# Patient Record
Sex: Female | Born: 1935 | Race: White | Hispanic: No | Marital: Married | Smoking: Never smoker
Health system: Southern US, Community
[De-identification: ages and names within clinical notes are randomized; demographics above are authoritative.]

## PROBLEM LIST (undated history)

## (undated) DIAGNOSIS — F809 Developmental disorder of speech and language, unspecified: Secondary | ICD-10-CM

## (undated) DIAGNOSIS — M545 Low back pain, unspecified: Secondary | ICD-10-CM

## (undated) DIAGNOSIS — I639 Cerebral infarction, unspecified: Secondary | ICD-10-CM

## (undated) DIAGNOSIS — K219 Gastro-esophageal reflux disease without esophagitis: Secondary | ICD-10-CM

## (undated) DIAGNOSIS — R413 Other amnesia: Secondary | ICD-10-CM

## (undated) DIAGNOSIS — G4700 Insomnia, unspecified: Secondary | ICD-10-CM

## (undated) DIAGNOSIS — F419 Anxiety disorder, unspecified: Secondary | ICD-10-CM

## (undated) HISTORY — DX: Low back pain: M54.5

## (undated) HISTORY — PX: ABDOMINAL HYSTERECTOMY: SHX81

## (undated) HISTORY — DX: Anxiety disorder, unspecified: F41.9

## (undated) HISTORY — PX: TOTAL VAGINAL HYSTERECTOMY: SHX2548

## (undated) HISTORY — DX: Developmental disorder of speech and language, unspecified: F80.9

## (undated) HISTORY — PX: ADENOIDECTOMY: SUR15

## (undated) HISTORY — DX: Low back pain, unspecified: M54.50

## (undated) HISTORY — DX: Gastro-esophageal reflux disease without esophagitis: K21.9

## (undated) HISTORY — PX: TONSILLECTOMY: SUR1361

## (undated) HISTORY — PX: APPENDECTOMY: SHX54

## (undated) HISTORY — DX: Insomnia, unspecified: G47.00

## (undated) HISTORY — PX: CHOLECYSTECTOMY: SHX55

## (undated) HISTORY — DX: Other amnesia: R41.3

---

## 1983-12-17 DIAGNOSIS — I639 Cerebral infarction, unspecified: Secondary | ICD-10-CM

## 1983-12-17 HISTORY — DX: Cerebral infarction, unspecified: I63.9

## 1999-04-17 ENCOUNTER — Observation Stay (HOSPITAL_COMMUNITY): Admission: RE | Admit: 1999-04-17 | Discharge: 1999-04-18 | Payer: Self-pay | Admitting: Obstetrics and Gynecology

## 1999-12-13 ENCOUNTER — Encounter: Admission: RE | Admit: 1999-12-13 | Discharge: 1999-12-13 | Payer: Self-pay | Admitting: Internal Medicine

## 1999-12-13 ENCOUNTER — Encounter: Payer: Self-pay | Admitting: Internal Medicine

## 2000-12-15 ENCOUNTER — Encounter: Admission: RE | Admit: 2000-12-15 | Discharge: 2000-12-15 | Payer: Self-pay | Admitting: Internal Medicine

## 2000-12-15 ENCOUNTER — Encounter: Payer: Self-pay | Admitting: Internal Medicine

## 2001-01-08 ENCOUNTER — Encounter: Payer: Self-pay | Admitting: Orthopedic Surgery

## 2001-01-08 ENCOUNTER — Encounter: Admission: RE | Admit: 2001-01-08 | Discharge: 2001-01-08 | Payer: Self-pay | Admitting: Orthopedic Surgery

## 2001-01-21 ENCOUNTER — Encounter: Admission: RE | Admit: 2001-01-21 | Discharge: 2001-01-21 | Payer: Self-pay | Admitting: Internal Medicine

## 2001-01-21 ENCOUNTER — Encounter: Payer: Self-pay | Admitting: Internal Medicine

## 2001-09-16 ENCOUNTER — Encounter: Admission: RE | Admit: 2001-09-16 | Discharge: 2001-09-16 | Payer: Self-pay | Admitting: Internal Medicine

## 2001-09-16 ENCOUNTER — Encounter: Payer: Self-pay | Admitting: Internal Medicine

## 2002-01-14 ENCOUNTER — Encounter: Admission: RE | Admit: 2002-01-14 | Discharge: 2002-01-14 | Payer: Self-pay | Admitting: Internal Medicine

## 2002-01-14 ENCOUNTER — Encounter: Payer: Self-pay | Admitting: Internal Medicine

## 2002-01-29 ENCOUNTER — Other Ambulatory Visit: Admission: RE | Admit: 2002-01-29 | Discharge: 2002-01-29 | Payer: Self-pay | Admitting: Obstetrics and Gynecology

## 2002-07-06 ENCOUNTER — Encounter: Admission: RE | Admit: 2002-07-06 | Discharge: 2002-07-06 | Payer: Self-pay | Admitting: Internal Medicine

## 2002-07-06 ENCOUNTER — Encounter: Payer: Self-pay | Admitting: Internal Medicine

## 2002-09-20 ENCOUNTER — Encounter: Admission: RE | Admit: 2002-09-20 | Discharge: 2002-09-20 | Payer: Self-pay | Admitting: Internal Medicine

## 2002-09-20 ENCOUNTER — Encounter: Payer: Self-pay | Admitting: Internal Medicine

## 2002-10-08 ENCOUNTER — Encounter: Payer: Self-pay | Admitting: Internal Medicine

## 2002-10-08 ENCOUNTER — Encounter: Admission: RE | Admit: 2002-10-08 | Discharge: 2002-10-08 | Payer: Self-pay | Admitting: Internal Medicine

## 2002-10-11 ENCOUNTER — Encounter: Admission: RE | Admit: 2002-10-11 | Discharge: 2002-10-11 | Payer: Self-pay | Admitting: Internal Medicine

## 2002-10-11 ENCOUNTER — Encounter: Payer: Self-pay | Admitting: Internal Medicine

## 2002-10-16 ENCOUNTER — Encounter: Payer: Self-pay | Admitting: Internal Medicine

## 2002-10-16 ENCOUNTER — Ambulatory Visit (HOSPITAL_COMMUNITY): Admission: RE | Admit: 2002-10-16 | Discharge: 2002-10-16 | Payer: Self-pay | Admitting: Internal Medicine

## 2003-01-17 ENCOUNTER — Encounter: Admission: RE | Admit: 2003-01-17 | Discharge: 2003-01-17 | Payer: Self-pay | Admitting: Internal Medicine

## 2003-01-17 ENCOUNTER — Encounter: Payer: Self-pay | Admitting: Internal Medicine

## 2003-01-17 ENCOUNTER — Other Ambulatory Visit: Admission: RE | Admit: 2003-01-17 | Discharge: 2003-01-17 | Payer: Self-pay | Admitting: Internal Medicine

## 2003-03-28 ENCOUNTER — Ambulatory Visit (HOSPITAL_COMMUNITY): Admission: RE | Admit: 2003-03-28 | Discharge: 2003-03-28 | Payer: Self-pay | Admitting: Orthopedic Surgery

## 2003-03-28 ENCOUNTER — Encounter: Payer: Self-pay | Admitting: Orthopedic Surgery

## 2003-04-05 ENCOUNTER — Encounter: Payer: Self-pay | Admitting: Orthopedic Surgery

## 2003-04-06 ENCOUNTER — Observation Stay (HOSPITAL_COMMUNITY): Admission: RE | Admit: 2003-04-06 | Discharge: 2003-04-07 | Payer: Self-pay | Admitting: Orthopedic Surgery

## 2003-06-27 ENCOUNTER — Ambulatory Visit: Admission: RE | Admit: 2003-06-27 | Discharge: 2003-06-27 | Payer: Self-pay | Admitting: Internal Medicine

## 2003-06-27 ENCOUNTER — Encounter: Payer: Self-pay | Admitting: Internal Medicine

## 2003-07-13 ENCOUNTER — Ambulatory Visit (HOSPITAL_COMMUNITY): Admission: RE | Admit: 2003-07-13 | Discharge: 2003-07-13 | Payer: Self-pay | Admitting: Gastroenterology

## 2003-09-15 ENCOUNTER — Ambulatory Visit (HOSPITAL_COMMUNITY): Admission: RE | Admit: 2003-09-15 | Discharge: 2003-09-15 | Payer: Self-pay | Admitting: Gastroenterology

## 2004-01-19 ENCOUNTER — Encounter: Admission: RE | Admit: 2004-01-19 | Discharge: 2004-01-19 | Payer: Self-pay | Admitting: Internal Medicine

## 2004-05-28 ENCOUNTER — Encounter: Admission: RE | Admit: 2004-05-28 | Discharge: 2004-05-28 | Payer: Self-pay | Admitting: Internal Medicine

## 2004-06-07 ENCOUNTER — Other Ambulatory Visit: Admission: RE | Admit: 2004-06-07 | Discharge: 2004-06-07 | Payer: Self-pay | Admitting: Obstetrics and Gynecology

## 2004-06-11 ENCOUNTER — Ambulatory Visit (HOSPITAL_COMMUNITY): Admission: RE | Admit: 2004-06-11 | Discharge: 2004-06-11 | Payer: Self-pay | Admitting: Obstetrics and Gynecology

## 2004-06-19 ENCOUNTER — Encounter: Admission: RE | Admit: 2004-06-19 | Discharge: 2004-06-19 | Payer: Self-pay | Admitting: Obstetrics and Gynecology

## 2004-06-20 ENCOUNTER — Ambulatory Visit (HOSPITAL_BASED_OUTPATIENT_CLINIC_OR_DEPARTMENT_OTHER): Admission: RE | Admit: 2004-06-20 | Discharge: 2004-06-20 | Payer: Self-pay | Admitting: Obstetrics and Gynecology

## 2004-06-20 ENCOUNTER — Ambulatory Visit (HOSPITAL_COMMUNITY): Admission: RE | Admit: 2004-06-20 | Discharge: 2004-06-20 | Payer: Self-pay | Admitting: Obstetrics and Gynecology

## 2004-06-20 ENCOUNTER — Encounter (INDEPENDENT_AMBULATORY_CARE_PROVIDER_SITE_OTHER): Payer: Self-pay | Admitting: *Deleted

## 2005-01-29 ENCOUNTER — Encounter: Admission: RE | Admit: 2005-01-29 | Discharge: 2005-01-29 | Payer: Self-pay | Admitting: Internal Medicine

## 2006-02-04 ENCOUNTER — Encounter: Admission: RE | Admit: 2006-02-04 | Discharge: 2006-02-04 | Payer: Self-pay | Admitting: Internal Medicine

## 2006-02-18 ENCOUNTER — Encounter: Admission: RE | Admit: 2006-02-18 | Discharge: 2006-02-18 | Payer: Self-pay | Admitting: Internal Medicine

## 2007-10-28 ENCOUNTER — Encounter: Admission: RE | Admit: 2007-10-28 | Discharge: 2007-10-28 | Payer: Self-pay | Admitting: Internal Medicine

## 2009-04-05 ENCOUNTER — Encounter: Admission: RE | Admit: 2009-04-05 | Discharge: 2009-04-05 | Payer: Self-pay | Admitting: Obstetrics and Gynecology

## 2009-05-24 ENCOUNTER — Encounter: Admission: RE | Admit: 2009-05-24 | Discharge: 2009-05-24 | Payer: Self-pay | Admitting: Internal Medicine

## 2009-07-28 ENCOUNTER — Encounter (INDEPENDENT_AMBULATORY_CARE_PROVIDER_SITE_OTHER): Payer: Self-pay | Admitting: Neurology

## 2009-07-28 ENCOUNTER — Ambulatory Visit: Payer: Self-pay

## 2009-07-31 ENCOUNTER — Encounter: Payer: Self-pay | Admitting: Cardiology

## 2011-01-06 ENCOUNTER — Encounter: Payer: Self-pay | Admitting: Internal Medicine

## 2011-01-16 ENCOUNTER — Other Ambulatory Visit: Payer: Self-pay | Admitting: Neurology

## 2011-01-16 DIAGNOSIS — R413 Other amnesia: Secondary | ICD-10-CM

## 2011-01-17 ENCOUNTER — Ambulatory Visit
Admission: RE | Admit: 2011-01-17 | Discharge: 2011-01-17 | Disposition: A | Discharge status: Home / Self Care | Payer: Medicare Other | Source: Ambulatory Visit | Attending: Neurology | Admitting: Neurology

## 2011-01-17 DIAGNOSIS — R413 Other amnesia: Secondary | ICD-10-CM

## 2011-05-03 NOTE — Op Note (Signed)
   NAME:  [name], [name]                          ACCOUNT NO.:  [id number]   MEDICAL RECORD NO.:  [id number]                   PATIENT TYPE:  AMB   LOCATION:  ENDO                                 FACILITY:  [facility]   PHYSICIAN:  Danise Edge, M.D.                DATE OF BIRTH:  11-Jan-1932   DATE OF PROCEDURE:  09/15/2003  DATE OF DISCHARGE:                                 OPERATIVE REPORT   PROCEDURE:  Proctocolonoscopy.   PROCEDURE INDICATION:  [name] is a 77 year old female, born  April 08, 1932.  [name] has recovered from a bout of diverticulitis  and is scheduled to undergo a screening colonoscopy with polypectomy to  prevent colon cancer.   ENDOSCOPIST:  Danise Edge, M.D.   PRE-MEDICATION:  Versed 7.5 mg, Demerol 50 mg.   PROCEDURE:  After obtaining informed consent, [name] was placed in the  left lateral decubitus position.  I administered intravenous Demerol and  intravenous Versed to achieve conscious sedation for the above procedure.  The patient's blood pressure, oxygen saturation and cardiac rhythm were  monitored throughout the procedure and documented in the medical record.   Anal inspection was normal.  Digital rectal exam was normal.  The Olympus  pediatric colonoscope was introduced into the rectum and advanced to the  cecum.  Colonic preparation for the exam today was excellent.   Rectum:  Normal.  Sigmoid colon and descending colon:  Left colonic  diverticulosis, without diverticulitis or diverticular stricture formation.  Splenic flexure:  Normal.  Transverse colon:  Normal.  Hepatic flexure:  Normal.  Ascending colon:  Normal.  Cecum and ileocecal valve:  Normal.   ASSESSMENT:  Left colonic diverticulosis; otherwise, normal  proctocolonoscopy to the cecum.  No endoscopic evidence for the presence of  colorectal neoplasia.                                               Danise Edge, M.D.    MJ/MEDQ  D:  09/15/2003  T:  09/15/2003   Job:  [id number]   cc:   Candyce Churn, M.D.  [address] [place]  [place] [address]  Fax: [phone]

## 2011-05-03 NOTE — Op Note (Signed)
   NAME:  [name], [name]                          ACCOUNT NO.:  [id number]   MEDICAL RECORD NO.:  [id number]                   PATIENT TYPE:  AMB   LOCATION:  ENDO                                 FACILITY:  [facility]   PHYSICIAN:  Danise Edge, M.D.                DATE OF BIRTH:  [birth year]   DATE OF PROCEDURE:  07/13/2003  DATE OF DISCHARGE:                                 OPERATIVE REPORT   PROCEDURES:  Esophagogastroduodenoscopy.   PROCEDURE INDICATIONS:  Ms. [name] is a 77 year old female with  chronic gastrointestinal reflux disease.  While on oxycodone, she developed  epigastric pain.  Since stopping her oxycodone, her epigastric pain as  resolved.   ENDOSCOPIST:  Danise Edge, M.D.   PREMEDICATION:  1. Versed 7.5 mg.  2. Demerol 50 mg.   DESCRIPTION OF PROCEDURE:  After obtaining informed consent, Ms. [name] was  placed in the left lateral decubitus position.  I administered intravenous  Demerol and intravenous Versed to achieve conscious sedation for the  procedure.  The patient's blood pressure, oxygen saturation, and cardiac  rhythm were monitored throughout the procedure and documented in the medical  record.   The Olympus gastroscope was passed through the posterior hypopharynx into  the proximal esophagus without difficulty.  The hypopharynx, larynx, and  vocal cords appeared normal.   Esophagoscopy:  The proximal, mid, and lower segments of the esophageal  mucosa appeared completely normal endoscopically.  There was no endoscopic  evidence for the presence of erosive esophagitis, Barrett's esophagus, or  esophageal mucosal scar.   Gastroscopy:  Ms. [name] has a very small hiatal hernia.  Retroflexed view  of her gastric cardia and fundus was normal.  The diaphragmatic hiatus was  slightly patulous.  The gastric body, antrum, and pyloris appear normal.   Duodenoscopy:  The duodenal bulb, mid duodenum, and distal duodenum appear  normal.   ASSESSMENT:   Normal esophagogastroduodenoscopy.                                               Danise Edge, M.D.    MJ/MEDQ  D:  07/13/2003  T:  07/13/2003  Job:  [id number]   cc:   Candyce Churn, M.D.  [address] [place]  [place] [address]  Fax: [phone]

## 2011-05-03 NOTE — Op Note (Signed)
   NAME:  [name], [name]                          ACCOUNT NO.:  [id number]   MEDICAL RECORD NO.:  [id number]                   PATIENT TYPE:  OBV   LOCATION:  0456                                 FACILITY:  [facility]   PHYSICIAN:  Georges Lynch. Gioffre, M.D.             DATE OF BIRTH:  12-24-1931   DATE OF PROCEDURE:  04/06/2003  DATE OF DISCHARGE:                                 OPERATIVE REPORT   PREOPERATIVE DIAGNOSIS:  Severe impingement syndrome with small undersurface  tears of the rotator cuff tendon.   POSTOPERATIVE DIAGNOSIS:  Severe impingement syndrome with small  undersurface tears of the rotator cuff tendon.   PROCEDURES:  1. Open decompression of the right shoulder by performing a partial     acromionectomy and acromioplasty.  2. Excision of subdeltoid bursa.  3. Exploration of the rotator cuff.   SURGEON:  Georges Lynch. Darrelyn Hillock, M.D.   ASSISTANT:  Ebbie Ridge. Paitsel, P.A.   DESCRIPTION OF PROCEDURE:  Under general anesthesia, prior the general  anesthesia she had an interscalene block to the right shoulder.  Sterile  prep and drape of the right shoulder was carried out.  She was given 1 g of  IV Ancef.  An incision was made over the anterior aspect of the right  shoulder, bleeders identified and cauterized.  I then inserted self-  retaining retractors and stripped the deltoid tendon from the acromion in  the usual fashion and partially split the proximal portion of the deltoid.  I went down immediately and noted a severe impingement syndrome.  Her  acromion was overgrowth, that is, it was much thicker than normal, and it  was beak-shaped and actually was embedding down into the tendon.  The tendon  was slightly abraded but not completely torn.  I further examined the tendon  after removing the subdeltoid bursa.  I took the shoulder through a complete  range of motion.  There was no other abnormality.  I did not feel that a  graft was necessary nor a tendon repair.  I thoroughly  irrigated out the  area after doing my acromionectomy with my oscillating saw and then  utilizing the bur to even out the area.  I then reapproximated the deltoid  tendon and the muscle in the usual fashion. The subcu was closed with 0  Vicryl, skin with metal staples.  A sterile Neosporin dressing was applied.  She was placed in a sling.                                               Ronald A. Darrelyn Hillock, M.D.    RAG/MEDQ  D:  04/06/2003  T:  04/06/2003  Job:  [id number]

## 2011-05-03 NOTE — Op Note (Signed)
NAME:  [name], [name]                          ACCOUNT NO.:  [id number]   MEDICAL RECORD NO.:  [id number]                   PATIENT TYPE:  AMB   LOCATION:  NESC                                 FACILITY:  [facility]   PHYSICIAN:  Malva Limes, M.D.                 DATE OF BIRTH:  1932-01-02   DATE OF PROCEDURE:  06/20/2004  DATE OF DISCHARGE:                                 OPERATIVE REPORT   PREOPERATIVE DIAGNOSES:  Left ovarian cyst in menopausal patient.   POSTOPERATIVE DIAGNOSES:  Left ovarian cyst in menopausal patient.   PROCEDURE:  Laparoscopic bilateral salpingo-oophorectomy.   SURGEON:  Malva Limes, M.D.   ANESTHESIA:  General endotracheal.   ANTIBIOTICS:  Ancef 1 g.   ESTIMATED BLOOD LOSS:  Minimal.   COMPLICATIONS:  None.   SPECIMENS:  Right and left fallopian tube and ovary sent to pathology.   FINDINGS:  Upon entering the abdominal cavity, the patient had no evidence  of any pelvic adhesions. There was a 3.4 cm left adnexal cyst, the right  ovary appeared to be normal.   DESCRIPTION OF PROCEDURE:  The patient was taken to the operating room where  a general anesthetic was administered without complications. She was then  placed in the dorsal lithotomy position.  She was prepped with Hibiclens and  her bladder drained with a red rubber catheter. She was then draped in the  usual fashion for this procedure.  Her umbilicus was injected with 0.25%  Marcaine. A vertical skin incision was made, fascia was grasped, incised  with the Mayo scissors. The parietoperitoneum was entered bluntly. Sutures  were placed in the fascia at 3 and 9 o'clock. The trocar was then placed in  the abdominal cavity and 3 liters of carbon monoxide was then insufflated.  The patient was then placed in Trendelenburg and the scope placed. A 5 mm  port was then placed in the suprapubic and left lower quadrant under direct  visualization. Once this was accomplished, it was discovered that the  left  adnexa was adherent to the left pelvic sidewall covered by a layer of  peritoneum.  The infundibulopelvic ligament was identified, grasped,  cauterized with a tripolar instrument and transected. Following this, the  ovary was peeled off the pelvic sidewall.  In doing this, the cyst was  ruptured and fluid was noted to be yellow and clear.  Once the ovary was  peeled off the pelvic sidewall, the specimen was removed through the 10 mm  trocar in the umbilical port. Following this, the right ovary was grasped  and infundibulopelvic ligament and ureter identified. The infundibulopelvic  ligament was then cauterized and transected, the ovary removed, peeled off  the sidewall, also removed through the umbilical port. Following this, the  pelvis was irrigation copiously, no active bleeding was identified, ureters  appeared to be normal. At this point, the pneumoperitoneum was released and  the  instruments removed. The fascia was closed with interrupted #0 Vicryl  suture, the skin closed with 4-0 Vicryl suture in  interrupted fashion. The patient was taken to the recovery room in stable  condition.  Instrument and lap counts were correct x1.  The patient was  discharged to home with Percocet to take p.r.n.  She was instructed to  followup in the office in two weeks.                                               Malva Limes, M.D.    MA/MEDQ  D:  06/20/2004  T:  06/20/2004  Job:  [id number]

## 2011-05-08 ENCOUNTER — Other Ambulatory Visit: Payer: Self-pay | Admitting: Obstetrics and Gynecology

## 2013-09-30 ENCOUNTER — Other Ambulatory Visit: Payer: Self-pay

## 2013-09-30 MED ORDER — DONEPEZIL HCL 10 MG PO TABS: 10.0000 mg | ORAL_TABLET | ORAL | Status: DC

## 2013-10-12 ENCOUNTER — Ambulatory Visit (INDEPENDENT_AMBULATORY_CARE_PROVIDER_SITE_OTHER): Payer: Medicare Other | Admitting: Neurology

## 2013-10-12 ENCOUNTER — Encounter (INDEPENDENT_AMBULATORY_CARE_PROVIDER_SITE_OTHER): Payer: Self-pay

## 2013-10-12 ENCOUNTER — Encounter: Payer: Self-pay | Admitting: Neurology

## 2013-10-12 VITALS — BP 142/79 | HR 76 | Resp 16 | Ht 67.0 in | Wt 166.0 lb

## 2013-10-12 DIAGNOSIS — F0390 Unspecified dementia without behavioral disturbance: Secondary | ICD-10-CM

## 2013-10-12 DIAGNOSIS — F068 Other specified mental disorders due to known physiological condition: Secondary | ICD-10-CM

## 2013-10-12 MED ORDER — DONEPEZIL HCL 10 MG PO TABS: 10.0000 mg | ORAL_TABLET | ORAL | Status: DC

## 2013-10-12 NOTE — Progress Notes (Signed)
[facility]  Provider:  Melvyn Novas, M D  Referring Provider: No ref. provider found Primary Care Physician:  [name], MD  Chief Complaint  Patient presents with  . Memory Loss    F/U Visit MOCA Test was given durring visit    HPI:  [name] is a 79 y.o. female  Is seen here as a referral/ revisit  from Dr. Kevan Ny.   Has been followed by me since 2011. I had placed her initially on Aricept medication to help her stabilize mammary and the band rapid progression of memory loss. Initially the patient was stretching the medication had was not taking it daily. She was repeatedly examined by aMini-Mental status exam and her point count had been 26/30 in general he does thousand 12 and 22/30 by October 2013 animal fluency test was 15 point at the time in October of 2013 her last visit with me she ag my concern was the immediate recall the patient was not is oriented and had no problems,  wears knowing birthdates and her address etc.  But she had difficulties recalling 3 words after being distracted with a calculation as questioned. Dr. Kevan Ny, I primary care physician had started her on Namenda in addition to the Aricept.  She seemed to tolerate the medications rather well.   MOCA was performed today - and patient scored 18 out of 30 points.  MMSE was not performed.    Review of Systems: Out of a complete 14 system review, the patient complains of only the following symptoms, and all other reviewed systems are negative.  the patient is still driving - I am opposed to her operating a vehicle.   Her husband corrected her , stating she does not drive by herself, and either he or their daughter will drive her. He than stated she drives herself to choir practice  Wednesdays just up the Road.  GDS 2 points,   History   Social History  . Marital Status: Married    Spouse Name: N/A    Number of Children: N/A  . Years of Education: N/A   Occupational History   . Not on file.   Social History Main Topics  . Smoking status: Never Smoker   . Smokeless tobacco: Not on file  . Alcohol Use: No  . Drug Use: No  . Sexual Activity: Not on file   Other Topics Concern  . Not on file   Social History Narrative  . No narrative on file    History reviewed. No pertinent family history.  Past Medical History  Diagnosis Date  . Anxiety   . GERD (gastroesophageal reflux disease)   . Insomnia   . Memory loss   . Lumbago   . Speech delay     Past Surgical History  Procedure Laterality Date  . Tonsillectomy    . Adenoidectomy    . Cholecystectomy    . Total vaginal hysterectomy      Current Outpatient Prescriptions  Medication Sig Dispense Refill  . aspirin 81 MG tablet Take 81 mg by mouth daily.      . Cyanocobalamin (VITAMIN B 12 PO) Take by mouth.      . donepezil (ARICEPT) 10 MG tablet Take 1 tablet (10 mg total) by mouth every morning.  30 tablet  0  . esomeprazole (NEXIUM) 10 MG packet Take 10 mg by mouth daily before breakfast.      . pantoprazole (PROTONIX) 40 MG tablet  No current facility-administered medications for this visit.    Allergies as of 10/12/2013  . (No Known Allergies)    Vitals: BP 142/79  Pulse 76  Resp 16  Ht 5\' 7"  (1.702 m)  Wt 166 lb (75.297 kg)  BMI 25.99 kg/m2 Last Weight:  Wt Readings from Last 1 Encounters:  10/12/13 166 lb (75.297 kg)   Last Height:   Ht Readings from Last 1 Encounters:  10/12/13 5\' 7"  (1.702 m)   Mental Status: Alert, oriented, thought content  Is tangential , she is scoring poor on MOCA at 18 points out of 30 , dementia.    Speech fluent without evidence of aphasia. Able to follow 3 step commands without difficulty. Cranial Nerves: II-Discs flat bilaterally. Visual fields grossly intact. III/IV/VI-Extraocular movements intact.  Pupils reactive bilaterally. V/VII-Smile symmetric VIII-grossly intact IX/X-normal gag XI-bilateral shoulder shrug XII-midline tongue  extension Motor: 5/5 bilaterally with normal tone and bulk Sensory: Pinprick and light touch intact throughout, bilaterally Deep Tendon Reflexes: 2+ and symmetric throughout Plantars: Downgoing bilaterally Cerebellar: Normal finger-to-nose, normal rapid alternating movements and normal heel-to-shin test.  Normal gait and station.   Assessment:  1)Dementia on Namenda and Aricept, meds to  continue.  EEG repeat.  2) patient  Is not to drive alone , at night or on high ways.

## 2013-10-12 NOTE — Patient Instructions (Signed)
Dementia Dementia is a general term for problems with brain function, short term memory loss.    A person with dementia has memory loss and a hard time with at least one other brain function such as thinking, speaking, or problem solving.  Dementia can affect social functioning, how you do your job, your mood, or your personality.  The changes may be hidden for a long time.  The earliest forms of this disease are usually not detected by family or friends. Dementia can be:  Irreversible.  Potentially reversible.  Partially reversible.  Progressive. This means it can get worse over time. CAUSES  Irreversible dementia causes may include:  Degeneration of brain cells (Alzheimer's disease or lewy body dementia).  Multiple small strokes (vascular dementia).  Infection (chronic meningitis or Creutzfelt-Jakob disease).  Frontotemporal dementia. This affects younger people, age 58 to 87, compared to those who have Alzheimer's disease.  Dementia associated with other disorders like Parkinson's disease, Huntington's disease, or HIV-associated dementia. Potentially or partially reversible dementia causes may include:  Medicines.  Metabolic causes such as excessive alcohol intake, vitamin B12 deficiency, or thyroid disease.  Masses or pressure in the brain such as a tumor, blood clot, or hydrocephalus. SYMPTOMS  Symptoms are often hard to detect. Family members or coworkers may not notice them early in the disease process. Different people with dementia may have different symptoms. Symptoms can include:  A hard time with memory, especially recent memory. Long-term memory may not be impaired.  Asking the same question multiple times or forgetting something someone just said.  A hard time speaking your thoughts or finding certain words.  A hard time solving problems or performing familiar tasks (such as how to use a telephone).  Sudden changes in mood.  Changes in personality,  especially increasing moodiness or mistrust.  Depression.  A hard time understanding complex ideas that were never a problem in the past. DIAGNOSIS  There are no specific tests for dementia.   Your caregiver may recommend a thorough evaluation. This is because some forms of dementia can be reversible. The evaluation will likely include a physical exam and getting a detailed history from you and a family member. The history often gives the best clues and suggestions for a diagnosis.  Memory testing may be done. A detailed brain function evaluation called neuropsychologic testing may be helpful.  Lab tests and brain imaging (such as a CT scan or MRI scan) are sometimes important.  Sometimes observation and re-evaluation over time is very helpful. TREATMENT  Treatment depends on the cause.   If the problem is a vitamin deficiency, it may be helped or cured with supplements.  For dementias such as Alzheimer's disease, medicines are available to stabilize or slow the course of the disease. There are no cures for this type of dementia.  Your caregiver can help direct you to groups, organizations, and other caregivers to help with decisions in the care of you or your loved one. HOME CARE INSTRUCTIONS The care of individuals with dementia is varied and dependent upon the progression of the dementia. The following suggestions are intended for the person living with, or caring for, the person with dementia.  Create a safe environment.  Remove the locks on bathroom doors to prevent the person from accidentally locking himself or herself in.  Use childproof latches on kitchen cabinets and any place where cleaning supplies, chemicals, or alcohol are kept.  Use childproof covers in unused electrical outlets.  Install childproof devices to keep doors  and windows secured.  Remove stove knobs or install safety knobs and an automatic shut-off on the stove.  Lower the temperature on water  heaters.  Label medicines and keep them locked up.  Secure knives, lighters, matches, power tools, and guns, and keep these items out of reach.  Keep the house free from clutter. Remove rugs or anything that might contribute to a fall.  Remove objects that might break and hurt the person.  Make sure lighting is good, both inside and outside.  Install grab rails as needed.  Use a monitoring device to alert you to falls or other needs for help.  Reduce confusion.  Keep familiar objects and people around.  Use night lights or dim lights at night.  Label items or areas.  Use reminders, notes, or directions for daily activities or tasks.  Keep a simple, consistent routine for waking, meals, bathing, dressing, and bedtime.  Create a calm, quiet environment.  Place large clocks and calendars prominently.  Display emergency numbers and home address near all telephones.  Use cues to establish different times of the day. An example is to open curtains to let the natural light in during the day.   Use effective communication.  Choose simple words and short sentences.  Use a gentle, calm tone of voice.  Be careful not to interrupt.  If the person is struggling to find a word or communicate a thought, try to provide the word or thought.  Ask one question at a time. Allow the person ample time to answer questions. Repeat the question again if the person does not respond.  Reduce nighttime restlessness.  Provide a comfortable bed.  Have a consistent nighttime routine.  Ensure a regular walking or physical activity schedule. Involve the person in daily activities as much as possible.  Limit napping during the day.  Limit caffeine.  Attend social events that stimulate rather than overwhelm the senses.  Encourage good nutrition and hydration.  Reduce distractions during meal times and snacks.  Avoid foods that are too hot or too cold.  Monitor chewing and swallowing  ability.  Continue with routine vision, hearing, dental, and medical screenings.  Only give over-the-counter or prescription medicines as directed by the caregiver.  Monitor driving abilities. Do not allow the person to drive when safe driving is no longer possible.  Register with an identification program which could provide location assistance in the event of a missing person situation. SEEK MEDICAL CARE IF:   New behavioral problems start such as moodiness, aggressiveness, or seeing things that are not there (hallucinations).  Any new problem with brain function happens. This includes problems with balance, speech, or falling a lot.  Problems with swallowing develop.  Any symptoms of other illness happen. Small changes or worsening in any aspect of brain function can be a sign that the illness is getting worse. It can also be a sign of another medical illness such as infection. Seeing a caregiver right away is important. SEEK IMMEDIATE MEDICAL CARE IF:   A fever develops.  New or worsened confusion develops.  New or worsened sleepiness develops.  Staying awake becomes hard to do. Document Released: 05/28/2001 Document Revised: 02/24/2012 Document Reviewed: 04/29/2011 [facility] Patient Information 2014 [place], [place].

## 2014-02-06 ENCOUNTER — Emergency Department (HOSPITAL_COMMUNITY): Payer: Medicare Other

## 2014-02-06 ENCOUNTER — Inpatient Hospital Stay (HOSPITAL_COMMUNITY)
Admission: EM | Admit: 2014-02-06 | Discharge: 2014-02-11 | DRG: 470 | Disposition: A | Discharge status: Home Health | Payer: Medicare Other | Attending: Internal Medicine | Admitting: Internal Medicine

## 2014-02-06 ENCOUNTER — Encounter (HOSPITAL_COMMUNITY): Payer: Self-pay | Admitting: Emergency Medicine

## 2014-02-06 DIAGNOSIS — I471 Supraventricular tachycardia: Secondary | ICD-10-CM | POA: Diagnosis not present

## 2014-02-06 DIAGNOSIS — W010XXA Fall on same level from slipping, tripping and stumbling without subsequent striking against object, initial encounter: Secondary | ICD-10-CM | POA: Diagnosis present

## 2014-02-06 DIAGNOSIS — Z8673 Personal history of transient ischemic attack (TIA), and cerebral infarction without residual deficits: Secondary | ICD-10-CM

## 2014-02-06 DIAGNOSIS — F411 Generalized anxiety disorder: Secondary | ICD-10-CM | POA: Diagnosis present

## 2014-02-06 DIAGNOSIS — S72009A Fracture of unspecified part of neck of unspecified femur, initial encounter for closed fracture: Principal | ICD-10-CM

## 2014-02-06 DIAGNOSIS — R000 Tachycardia, unspecified: Secondary | ICD-10-CM | POA: Diagnosis not present

## 2014-02-06 DIAGNOSIS — E876 Hypokalemia: Secondary | ICD-10-CM | POA: Diagnosis not present

## 2014-02-06 DIAGNOSIS — F068 Other specified mental disorders due to known physiological condition: Secondary | ICD-10-CM

## 2014-02-06 DIAGNOSIS — W009XXA Unspecified fall due to ice and snow, initial encounter: Secondary | ICD-10-CM

## 2014-02-06 DIAGNOSIS — Z7982 Long term (current) use of aspirin: Secondary | ICD-10-CM

## 2014-02-06 DIAGNOSIS — R413 Other amnesia: Secondary | ICD-10-CM | POA: Diagnosis present

## 2014-02-06 DIAGNOSIS — K219 Gastro-esophageal reflux disease without esophagitis: Secondary | ICD-10-CM

## 2014-02-06 DIAGNOSIS — F0390 Unspecified dementia without behavioral disturbance: Secondary | ICD-10-CM

## 2014-02-06 DIAGNOSIS — S72002A Fracture of unspecified part of neck of left femur, initial encounter for closed fracture: Secondary | ICD-10-CM

## 2014-02-06 HISTORY — DX: Cerebral infarction, unspecified: I63.9

## 2014-02-06 LAB — URINALYSIS, ROUTINE W REFLEX MICROSCOPIC
Glucose, UA: 100 mg/dL — AB
HGB URINE DIPSTICK: NEGATIVE
Ketones, ur: 15 mg/dL — AB
LEUKOCYTES UA: NEGATIVE
NITRITE: NEGATIVE
Protein, ur: NEGATIVE mg/dL
SPECIFIC GRAVITY, URINE: 1.028 (ref 1.005–1.030)
Urobilinogen, UA: 0.2 mg/dL (ref 0.0–1.0)
pH: 5.5 (ref 5.0–8.0)

## 2014-02-06 LAB — BASIC METABOLIC PANEL
BUN: 16 mg/dL (ref 6–23)
CHLORIDE: 105 meq/L (ref 96–112)
CO2: 23 meq/L (ref 19–32)
CREATININE: 0.64 mg/dL (ref 0.50–1.10)
Calcium: 9.2 mg/dL (ref 8.4–10.5)
GFR, EST NON AFRICAN AMERICAN: 82 mL/min — AB (ref >90)
GLUCOSE: 112 mg/dL — AB (ref 70–99)
POTASSIUM: 3.5 meq/L — AB (ref 3.7–5.3)
Sodium: 143 mEq/L (ref 137–147)

## 2014-02-06 LAB — PROTIME-INR
INR: 0.96 (ref 0.00–1.49)
PROTHROMBIN TIME: 12.6 s (ref 11.6–15.2)

## 2014-02-06 LAB — CBC WITH DIFFERENTIAL/PLATELET
Basophils Absolute: 0 10*3/uL (ref 0.0–0.1)
Basophils Relative: 1 % (ref 0–1)
Eosinophils Absolute: 0.1 10*3/uL (ref 0.0–0.7)
Eosinophils Relative: 2 % (ref 0–5)
HEMATOCRIT: 42.8 % (ref 36.0–46.0)
Hemoglobin: 14.5 g/dL (ref 12.0–15.0)
Lymphocytes Relative: 31 % (ref 12–46)
Lymphs Abs: 1.9 10*3/uL (ref 0.7–4.0)
MCH: 29.8 pg (ref 26.0–34.0)
MCHC: 33.9 g/dL (ref 30.0–36.0)
MCV: 88.1 fL (ref 78.0–100.0)
MONOS PCT: 9 % (ref 3–12)
Monocytes Absolute: 0.5 10*3/uL (ref 0.1–1.0)
NEUTROS PCT: 58 % (ref 43–77)
Neutro Abs: 3.5 10*3/uL (ref 1.7–7.7)
PLATELETS: 255 10*3/uL (ref 150–400)
RBC: 4.86 MIL/uL (ref 3.87–5.11)
RDW: 13.2 % (ref 11.5–15.5)
WBC: 6 10*3/uL (ref 4.0–10.5)

## 2014-02-06 LAB — TYPE AND SCREEN
ABO/RH(D): O POS
Antibody Screen: NEGATIVE

## 2014-02-06 LAB — ABO/RH: ABO/RH(D): O POS

## 2014-02-06 MED ORDER — DEXTROSE 5 % IV SOLN: 500.0000 mg | Freq: Four times a day (QID) | INTRAVENOUS | Status: DC | PRN

## 2014-02-06 MED ORDER — SENNA 8.6 MG PO TABS
1.0000 | ORAL_TABLET | Freq: Two times a day (BID) | ORAL | Status: DC
  Administered 2014-02-06 – 2014-02-10 (×7): 8.6 mg via ORAL
  Filled 2014-02-06 (×13): qty 1

## 2014-02-06 MED ORDER — MORPHINE SULFATE 2 MG/ML IJ SOLN
2.0000 mg | INTRAMUSCULAR | Status: DC | PRN
  Administered 2014-02-06: 2 mg via INTRAVENOUS
  Filled 2014-02-06: qty 1

## 2014-02-06 MED ORDER — KCL IN DEXTROSE-NACL 20-5-0.45 MEQ/L-%-% IV SOLN
INTRAVENOUS | Status: DC
  Administered 2014-02-06 – 2014-02-08 (×3): via INTRAVENOUS
  Filled 2014-02-06 (×11): qty 1000

## 2014-02-06 MED ORDER — HYDROCODONE-ACETAMINOPHEN 5-325 MG PO TABS
1.0000 | ORAL_TABLET | Freq: Four times a day (QID) | ORAL | Status: DC | PRN
  Administered 2014-02-06 – 2014-02-07 (×2): 1 via ORAL
  Administered 2014-02-07: 2 via ORAL
  Administered 2014-02-07 – 2014-02-11 (×3): 1 via ORAL
  Filled 2014-02-06: qty 2
  Filled 2014-02-06 (×5): qty 1

## 2014-02-06 MED ORDER — METHOCARBAMOL 500 MG PO TABS
500.0000 mg | ORAL_TABLET | Freq: Four times a day (QID) | ORAL | Status: DC | PRN
  Administered 2014-02-07 (×2): 500 mg via ORAL
  Filled 2014-02-06 (×2): qty 1

## 2014-02-06 MED ORDER — MORPHINE SULFATE 2 MG/ML IJ SOLN: 0.5000 mg | INTRAMUSCULAR | Status: DC | PRN

## 2014-02-06 MED ORDER — ONDANSETRON HCL 4 MG/2ML IJ SOLN
4.0000 mg | Freq: Once | INTRAMUSCULAR | Status: AC
  Administered 2014-02-06: 4 mg via INTRAVENOUS
  Filled 2014-02-06: qty 2

## 2014-02-06 MED ORDER — DOCUSATE SODIUM 100 MG PO CAPS
100.0000 mg | ORAL_CAPSULE | Freq: Two times a day (BID) | ORAL | Status: DC
  Administered 2014-02-06 – 2014-02-10 (×7): 100 mg via ORAL
  Filled 2014-02-06 (×11): qty 1

## 2014-02-06 NOTE — ED Provider Notes (Signed)
CSN: [id number]     Arrival date & time 02/06/14  1305 History   First MD Initiated Contact with Patient 02/06/14 1317     Chief Complaint  Patient presents with  . Fall  . Hip Pain     (Consider location/radiation/quality/duration/timing/severity/associated sxs/prior Treatment) HPI Comments: Patient with history of dementia presents after a fall. Patient slipped on the ice onto her left hip. She was unable to get up without assistance. She was helped to standing but can ambulate. She did not hit her head or shoulder. She currently complains of minimal pain in left hip and thigh which is much worse with movement. No treatments prior to arrival. Onset of symptoms acute. Course is constant.  The history is provided by the patient, medical records, the spouse and a relative.    Past Medical History  Diagnosis Date  . Anxiety   . GERD (gastroesophageal reflux disease)   . Insomnia   . Memory loss   . Lumbago   . dementia   . Stroke 1985    no deficits   Past Surgical History  Procedure Laterality Date  . Tonsillectomy    . Adenoidectomy    . Cholecystectomy    . Total vaginal hysterectomy    . Appendectomy    . Abdominal hysterectomy     Family History  Problem Relation Age of Onset  . Cancer Mother [phone]  . Cancer Father [phone]  . Cancer Father    History  Substance Use Topics  . Smoking status: Never Smoker   . Smokeless tobacco: Not on file  . Alcohol Use: No   OB History   Grav Para Term Preterm Abortions TAB SAB Ect Mult Living                 Review of Systems  Constitutional: Negative for fever.  HENT: Negative for rhinorrhea and sore throat.   Eyes: Negative for redness.  Respiratory: Negative for cough.   Cardiovascular: Negative for chest pain.  Gastrointestinal: Negative for nausea, vomiting, abdominal pain and diarrhea.  Genitourinary: Negative for dysuria.  Musculoskeletal: Positive for arthralgias, gait problem and myalgias.  Skin: Negative for rash.    Neurological: Positive for weakness. Negative for headaches.      Allergies  Review of patient's allergies indicates no known allergies.  Home Medications   Current Outpatient Rx  Name  Route  Sig  Dispense  Refill  . aspirin 81 MG tablet   Oral   Take 81 mg by mouth daily.         . Cyanocobalamin (VITAMIN B 12 PO)   Oral   Take by mouth.         . donepezil (ARICEPT) 10 MG tablet   Oral   Take 1 tablet (10 mg total) by mouth every morning.   90 tablet   3   . esomeprazole (NEXIUM) 10 MG packet   Oral   Take 10 mg by mouth daily before breakfast.         . pantoprazole (PROTONIX) 40 MG tablet                BP 164/69  Pulse 80  Temp(Src) 98 F (36.7 C) (Oral)  Resp 18  SpO2 97% Physical Exam  Nursing note and vitals reviewed. Constitutional: She appears well-developed and well-nourished.  HENT:  Head: Normocephalic and atraumatic.  Eyes: Conjunctivae are normal. Pupils are equal, round, and reactive to light. Right eye exhibits no discharge. Left eye exhibits no  discharge.  Neck: Normal range of motion. Neck supple.  Cardiovascular: Normal rate, regular rhythm and normal heart sounds.  Exam reveals no decreased pulses.   Pulmonary/Chest: Effort normal and breath sounds normal.  Abdominal: Soft. There is no tenderness.  Musculoskeletal: She exhibits tenderness. She exhibits no edema.       Left hip: She exhibits decreased range of motion, decreased strength and tenderness.       Left knee: Normal.       Cervical back: She exhibits normal range of motion, no tenderness and no bony tenderness.       Lumbar back: Normal.       Legs: Neurological: She is alert. No sensory deficit.  Motor, sensation, and vascular distal to the injury is fully intact.   Skin: Skin is warm and dry.  Psychiatric: She has a normal mood and affect.    ED Course  Procedures (including critical care time) Labs Review Labs Reviewed  BASIC METABOLIC PANEL - Abnormal;  Notable for the following:    Potassium 3.5 (*)    Glucose, Bld 112 (*)    GFR calc non Af Amer 82 (*)    All other components within normal limits  CBC WITH DIFFERENTIAL  PROTIME-INR  TYPE AND SCREEN   Imaging Review Dg Chest 1 View  02/06/2014   CLINICAL DATA:  Fall, left hip fracture  EXAM: CHEST - 1 VIEW  COMPARISON:  06/19/2004  FINDINGS: Mild cardiomegaly. Negative for CHF or pneumonia. Minor basilar atelectasis. No effusion or pneumothorax. Trachea midline. Atherosclerosis of the aorta.  IMPRESSION: Cardiomegaly without acute process   Electronically Signed   By: Ruel Favors M.D.   On: 02/06/2014 15:33   Dg Hip Complete Left  02/06/2014   CLINICAL DATA:  Fall.  Left hip pain.  EXAM: LEFT HIP - COMPLETE 2+ VIEW  COMPARISON:  None.  FINDINGS: Acute, impacted left femoral neck fracture is seen. No evidence of dislocation. No pelvic fracture identified.  IMPRESSION: Acute left femoral neck fracture.   Electronically Signed   By: Myles Rosenthal M.D.   On: 02/06/2014 15:31    EKG Interpretation   None      1:55 PM Patient seen and examined. Work-up initiated. Medications ordered.   Vital signs reviewed and are as follows: Filed Vitals:   02/06/14 1345  BP: 164/69  Pulse: 80  Temp:   Resp: 18   4:33 PM Pt with hip fracture. Family and patient informed.   Spoke with Dr. Charlann Boxer. There is possibility he could do surgery tonight.   Spoke with Dr. Lendell Caprice who will admit.   Family updated. Pt's pain is controlled.    MDM   Final diagnoses:  Femoral neck fracture   Admit for surgical repair.     Renne Crigler, PA-C 02/06/14 [phone]

## 2014-02-06 NOTE — H&P (Signed)
[facility] History and Physical  Jamie HatcherHelen Barber ZOX:[id number] DOB: [birth year] DOA: 02/06/2014  Referring physician: EDP PCP: Pearla DubonnetGATES,ROBERT NEVILL, MD   Chief Complaint: fall, left hip pain  HPI: Jamie HatcherHelen Barber is a 80 y.o. female  With a history of dementia and acid reflux who slipped on the ice and was unable to ambulate. Has left hip pain. X-ray shows a left femoral neck fracture. Dr. Charlann Boxerlin has been consulted and will operate tomorrow. Patient's pain is currently fairly well controlled. She has no nausea. No history of heart or lung disease. No other complaints.  Review of Systems:  Systems review. As above otherwise negative.  Past Medical History  Diagnosis Date  . Anxiety   . GERD (gastroesophageal reflux disease)   . Insomnia   . Memory loss   . Lumbago   . dementia   . Stroke 1985    no deficits   Past Surgical History  Procedure Laterality Date  . Tonsillectomy    . Adenoidectomy    . Cholecystectomy    . Total vaginal hysterectomy    . Appendectomy    . Abdominal hysterectomy     Social History:  reports that she has never smoked. She does not have any smokeless tobacco history on file. She reports that she does not drink alcohol or use illicit drugs.  No Known Allergies  Family History  Problem Relation Age of Onset  . Cancer Mother [phone]  . Cancer Father [phone]  . Cancer Father      Prior to Admission medications   Medication Sig Start Date End Date Taking? Authorizing Provider  aspirin 81 MG tablet Take 81 mg by mouth daily.   Yes Historical Provider, MD  Cyanocobalamin (VITAMIN B 12 PO) Take 1 tablet by mouth daily.    Yes Historical Provider, MD  donepezil (ARICEPT) 10 MG tablet Take 1 tablet (10 mg total) by mouth every morning. 10/12/13  Yes Carmen Dohmeier, MD  esomeprazole (NEXIUM) 10 MG packet Take 10 mg by mouth daily before breakfast.   Yes Historical Provider, MD  pantoprazole (PROTONIX) 40 MG tablet Take 40 mg by mouth daily.  10/07/13  Yes  Historical Provider, MD  Vitamin D, Cholecalciferol, 1000 UNITS TABS Take 2,000 Units by mouth daily.   Yes Historical Provider, MD   Physical Exam: Filed Vitals:   02/06/14 1714  BP: 152/68  Pulse: 77  Temp: 98 F (36.7 C)  Resp: 16    BP 152/68  Pulse 77  Temp(Src) 98 F (36.7 C) (Oral)  Resp 16  Ht 5\' 6"  (1.676 m)  Wt 72.576 kg (160 lb)  BMI 25.84 kg/m2  SpO2 96% BP 152/68  Pulse 77  Temp(Src) 98 F (36.7 C) (Oral)  Resp 16  Ht 5\' 6"  (1.676 m)  Wt 72.576 kg (160 lb)  BMI 25.84 kg/m2  SpO2 96%  General Appearance:    Alert, cooperative, no distress, appears stated age, slightly forgetful.   Head:    Normocephalic, without obvious abnormality, atraumatic  Eyes:    PERRL, conjunctiva/corneas clear, EOM's intact, fundi    benign, both eyes     Nose:   Nares normal, septum midline, mucosa normal, no drainage    or sinus tenderness  Throat:   Lips, mucosa, and tongue normal; teeth and gums normal  Neck:   Supple, symmetrical, trachea midline, no adenopathy;    thyroid:  no enlargement/tenderness/nodules; no carotid   bruit or JVD     Lungs:     Clear to  auscultation bilaterally, respirations unlabored  Chest Wall:    No tenderness or deformity   Heart:    Regular rate and rhythm, S1 and S2 normal, no murmur, rub   or gallop     Abdomen:     Soft, non-tender, bowel sounds active all four quadrants,    no masses, no organomegaly  Genitalia:   deferred  Rectal:   deferred  Extremities:   Left hip tender. Leg short, externally rotated  Pulses:   2+ and symmetric all extremities  Skin:   Skin color, texture, turgor normal, no rashes or lesions  Lymph nodes:   Cervical, supraclavicular, and axillary nodes normal  Neurologic:   CNII-XII intact, normal strength, sensation and reflexes    throughout    Psych: normal affect. Calm and cooperative        Labs on Admission:  Basic Metabolic Panel:  Recent Labs Lab 02/06/14 1349  NA 143  K 3.5*  CL 105  CO2 23    GLUCOSE 112*  BUN 16  CREATININE 0.64  CALCIUM 9.2   Liver Function Tests: No results found for this basename: AST, ALT, ALKPHOS, BILITOT, PROT, ALBUMIN,  in the last 168 hours No results found for this basename: LIPASE, AMYLASE,  in the last 168 hours No results found for this basename: AMMONIA,  in the last 168 hours CBC:  Recent Labs Lab 02/06/14 1349  WBC 6.0  NEUTROABS 3.5  HGB 14.5  HCT 42.8  MCV 88.1  PLT 255   Cardiac Enzymes: No results found for this basename: CKTOTAL, CKMB, CKMBINDEX, TROPONINI,  in the last 168 hours  BNP (last 3 results) No results found for this basename: PROBNP,  in the last 8760 hours CBG: No results found for this basename: GLUCAP,  in the last 168 hours  Radiological Exams on Admission: Dg Chest 1 View  02/06/2014   CLINICAL DATA:  Fall, left hip fracture  EXAM: CHEST - 1 VIEW  COMPARISON:  06/19/2004  FINDINGS: Mild cardiomegaly. Negative for CHF or pneumonia. Minor basilar atelectasis. No effusion or pneumothorax. Trachea midline. Atherosclerosis of the aorta.  IMPRESSION: Cardiomegaly without acute process   Electronically Signed   By: Ruel Favors M.D.   On: 02/06/2014 15:33   Dg Hip Complete Left  02/06/2014   CLINICAL DATA:  Fall.  Left hip pain.  EXAM: LEFT HIP - COMPLETE 2+ VIEW  COMPARISON:  None.  FINDINGS: Acute, impacted left femoral neck fracture is seen. No evidence of dislocation. No pelvic fracture identified.  IMPRESSION: Acute left femoral neck fracture.   Electronically Signed   By: Myles Rosenthal M.D.   On: 02/06/2014 15:31    EKG: pending  Assessment/Plan Principal Problem:   Fracture of femoral neck, left Active Problems:   Dementia arising in the senium and presenium   Fall due to ice or snow   GERD (gastroesophageal reflux disease) hypokalemia: replete.  As long as EKG ok, no contraindications to surgery tomorrow. Will make NPO after midnight. Dr. Kevan Ny to assume care in am.  Time spent: 60  MIN  Naveed Humphres L [facility] Pager [phone]

## 2014-02-06 NOTE — ED Notes (Signed)
Pt slipped on ice and landed on L hip.  Now states she cannot bear weight on L weight.  No deformities noted.

## 2014-02-06 NOTE — ED Notes (Signed)
Dr Harrison at bedside

## 2014-02-07 ENCOUNTER — Encounter (HOSPITAL_COMMUNITY)
Admission: EM | Disposition: A | Discharge status: Home Health | Payer: Self-pay | Source: Home / Self Care | Attending: Internal Medicine

## 2014-02-07 ENCOUNTER — Encounter (HOSPITAL_COMMUNITY): Payer: Self-pay | Admitting: Certified Registered Nurse Anesthetist

## 2014-02-07 ENCOUNTER — Inpatient Hospital Stay (HOSPITAL_COMMUNITY): Payer: Medicare Other | Admitting: Anesthesiology

## 2014-02-07 ENCOUNTER — Encounter (HOSPITAL_COMMUNITY): Payer: Medicare Other | Admitting: Anesthesiology

## 2014-02-07 ENCOUNTER — Inpatient Hospital Stay (HOSPITAL_COMMUNITY): Payer: Medicare Other

## 2014-02-07 HISTORY — PX: HIP ARTHROPLASTY: SHX981

## 2014-02-07 LAB — URINE CULTURE
CULTURE: NO GROWTH
Colony Count: NO GROWTH

## 2014-02-07 LAB — SURGICAL PCR SCREEN
MRSA, PCR: NEGATIVE
Staphylococcus aureus: NEGATIVE

## 2014-02-07 SURGERY — HEMIARTHROPLASTY, HIP, DIRECT ANTERIOR APPROACH, FOR FRACTURE
Anesthesia: General | Site: Hip | Laterality: Left

## 2014-02-07 MED ORDER — LIDOCAINE HCL (CARDIAC) 20 MG/ML IV SOLN
INTRAVENOUS | Status: DC | PRN
  Administered 2014-02-07: 100 mg via INTRAVENOUS

## 2014-02-07 MED ORDER — SODIUM CHLORIDE 0.9 % IR SOLN
Status: DC | PRN
  Administered 2014-02-07: 1000 mL

## 2014-02-07 MED ORDER — PHENYLEPHRINE HCL 10 MG/ML IJ SOLN
INTRAMUSCULAR | Status: AC
  Filled 2014-02-07: qty 1

## 2014-02-07 MED ORDER — FENTANYL CITRATE 0.05 MG/ML IJ SOLN
INTRAMUSCULAR | Status: DC | PRN
  Administered 2014-02-07 (×4): 50 ug via INTRAVENOUS

## 2014-02-07 MED ORDER — ARTIFICIAL TEARS OP OINT
TOPICAL_OINTMENT | OPHTHALMIC | Status: DC | PRN
  Administered 2014-02-07: 1 via OPHTHALMIC

## 2014-02-07 MED ORDER — HYDROCODONE-ACETAMINOPHEN 5-325 MG PO TABS: 1.0000 | ORAL_TABLET | Freq: Four times a day (QID) | ORAL | Status: DC | PRN

## 2014-02-07 MED ORDER — OXYCODONE HCL 5 MG/5ML PO SOLN: 5.0000 mg | Freq: Once | ORAL | Status: DC | PRN

## 2014-02-07 MED ORDER — LACTATED RINGERS IV SOLN
INTRAVENOUS | Status: DC
  Administered 2014-02-07: 16:00:00 via INTRAVENOUS

## 2014-02-07 MED ORDER — PROPOFOL 10 MG/ML IV BOLUS
INTRAVENOUS | Status: DC | PRN
  Administered 2014-02-07: 120 mg via INTRAVENOUS

## 2014-02-07 MED ORDER — PHENYLEPHRINE HCL 10 MG/ML IJ SOLN
INTRAMUSCULAR | Status: DC | PRN
Start: 2014-02-07 — End: 2014-02-07
  Administered 2014-02-07 (×2): 80 ug via INTRAVENOUS
  Administered 2014-02-07: 40 ug via INTRAVENOUS

## 2014-02-07 MED ORDER — FENTANYL CITRATE 0.05 MG/ML IJ SOLN
INTRAMUSCULAR | Status: AC
  Filled 2014-02-07: qty 5

## 2014-02-07 MED ORDER — PHENYLEPHRINE HCL 10 MG/ML IJ SOLN
10.0000 mg | INTRAMUSCULAR | Status: DC | PRN
  Administered 2014-02-07: 5 ug/min via INTRAVENOUS

## 2014-02-07 MED ORDER — LORAZEPAM 2 MG/ML IJ SOLN: 1.0000 mg | Freq: Four times a day (QID) | INTRAMUSCULAR | Status: DC | PRN

## 2014-02-07 MED ORDER — CEFAZOLIN SODIUM-DEXTROSE 2-3 GM-% IV SOLR
INTRAVENOUS | Status: AC
  Administered 2014-02-07: 2 g via INTRAVENOUS
  Filled 2014-02-07: qty 50

## 2014-02-07 MED ORDER — METOPROLOL TARTRATE 1 MG/ML IV SOLN
INTRAVENOUS | Status: AC
  Filled 2014-02-07: qty 5

## 2014-02-07 MED ORDER — OXYCODONE HCL 5 MG PO TABS: 5.0000 mg | ORAL_TABLET | Freq: Once | ORAL | Status: DC | PRN

## 2014-02-07 MED ORDER — METOPROLOL TARTRATE 1 MG/ML IV SOLN
2.0000 mg | INTRAVENOUS | Status: DC
  Administered 2014-02-07 (×2): 2 mg via INTRAVENOUS

## 2014-02-07 MED ORDER — ONDANSETRON HCL 4 MG/2ML IJ SOLN
INTRAMUSCULAR | Status: DC | PRN
  Administered 2014-02-07: 4 mg via INTRAVENOUS

## 2014-02-07 MED ORDER — GLYCOPYRROLATE 0.2 MG/ML IJ SOLN
INTRAMUSCULAR | Status: DC | PRN
  Administered 2014-02-07: 0.4 mg via INTRAVENOUS

## 2014-02-07 MED ORDER — MIDAZOLAM HCL 2 MG/2ML IJ SOLN: 1.0000 mg | INTRAMUSCULAR | Status: DC | PRN

## 2014-02-07 MED ORDER — NEOSTIGMINE METHYLSULFATE 1 MG/ML IJ SOLN
INTRAMUSCULAR | Status: DC | PRN
  Administered 2014-02-07: 4 mg via INTRAVENOUS

## 2014-02-07 MED ORDER — LACTATED RINGERS IV SOLN
INTRAVENOUS | Status: DC | PRN
  Administered 2014-02-07 (×2): via INTRAVENOUS

## 2014-02-07 MED ORDER — ROCURONIUM BROMIDE 100 MG/10ML IV SOLN
INTRAVENOUS | Status: DC | PRN
  Administered 2014-02-07: 50 mg via INTRAVENOUS

## 2014-02-07 MED ORDER — ASPIRIN EC 325 MG PO TBEC: 325.0000 mg | DELAYED_RELEASE_TABLET | Freq: Two times a day (BID) | ORAL | Status: AC

## 2014-02-07 MED ORDER — KETOROLAC TROMETHAMINE 30 MG/ML IJ SOLN: 15.0000 mg | Freq: Once | INTRAMUSCULAR | Status: DC | PRN

## 2014-02-07 MED ORDER — LORAZEPAM 1 MG PO TABS
1.0000 mg | ORAL_TABLET | Freq: Four times a day (QID) | ORAL | Status: DC | PRN
  Administered 2014-02-08 – 2014-02-11 (×6): 1 mg via ORAL
  Filled 2014-02-07 (×6): qty 1

## 2014-02-07 MED ORDER — FENTANYL CITRATE 0.05 MG/ML IJ SOLN: 25.0000 ug | INTRAMUSCULAR | Status: DC | PRN

## 2014-02-07 MED ORDER — FENTANYL CITRATE 0.05 MG/ML IJ SOLN: 50.0000 ug | Freq: Once | INTRAMUSCULAR | Status: DC

## 2014-02-07 SURGICAL SUPPLY — 51 items
ADH SKN CLS APL DERMABOND .7 (GAUZE/BANDAGES/DRESSINGS) ×1
ADH SKN CLS LQ APL DERMABOND (GAUZE/BANDAGES/DRESSINGS) ×1
BLADE SAW SGTL 18X1.27X75 (BLADE) ×2 IMPLANT
BLADE SAW SGTL 18X1.27X75MM (BLADE) ×1
CAPT HIP FX BIPOLAR/UNIPOLAR ×2 IMPLANT
CLOTH BEACON ORANGE TIMEOUT ST (SAFETY) ×3 IMPLANT
COVER SURGICAL LIGHT HANDLE (MISCELLANEOUS) ×3 IMPLANT
DERMABOND ADHESIVE PROPEN (GAUZE/BANDAGES/DRESSINGS) ×2
DERMABOND ADVANCED (GAUZE/BANDAGES/DRESSINGS) ×2
DERMABOND ADVANCED .7 DNX12 (GAUZE/BANDAGES/DRESSINGS) ×1 IMPLANT
DERMABOND ADVANCED .7 DNX6 (GAUZE/BANDAGES/DRESSINGS) IMPLANT
DRAPE INCISE IOBAN 85X60 (DRAPES) ×3 IMPLANT
DRAPE ORTHO SPLIT 77X108 STRL (DRAPES) ×6
DRAPE SURG ORHT 6 SPLT 77X108 (DRAPES) ×2 IMPLANT
DRAPE U-SHAPE 47X51 STRL (DRAPES) ×3 IMPLANT
DRSG AQUACEL AG ADV 3.5X10 (GAUZE/BANDAGES/DRESSINGS) ×3 IMPLANT
DURAPREP 26ML APPLICATOR (WOUND CARE) ×3 IMPLANT
ELECT BLADE 4.0 EZ CLEAN MEGAD (MISCELLANEOUS) ×3
ELECT REM PT RETURN 9FT ADLT (ELECTROSURGICAL) ×3
ELECTRODE BLDE 4.0 EZ CLN MEGD (MISCELLANEOUS) ×1 IMPLANT
ELECTRODE REM PT RTRN 9FT ADLT (ELECTROSURGICAL) ×1 IMPLANT
EVACUATOR 1/8 PVC DRAIN (DRAIN) IMPLANT
FACESHIELD LNG OPTICON STERILE (SAFETY) ×6 IMPLANT
GLOVE BIOGEL PI IND STRL 7.5 (GLOVE) ×1 IMPLANT
GLOVE BIOGEL PI IND STRL 8 (GLOVE) ×2 IMPLANT
GLOVE BIOGEL PI INDICATOR 7.5 (GLOVE) ×2
GLOVE BIOGEL PI INDICATOR 8 (GLOVE) ×4
GLOVE ECLIPSE 8.0 STRL XLNG CF (GLOVE) ×3 IMPLANT
GLOVE ORTHO TXT STRL SZ7.5 (GLOVE) ×3 IMPLANT
GLOVE SURG ORTHO 8.0 STRL STRW (GLOVE) ×3 IMPLANT
GOWN STRL NON-REIN LRG LVL3 (GOWN DISPOSABLE) ×9 IMPLANT
GOWN STRL REIN 3XL XLG LVL4 (GOWN DISPOSABLE) ×3 IMPLANT
HANDPIECE INTERPULSE COAX TIP (DISPOSABLE)
IMMOBILIZER KNEE 22 UNIV (SOFTGOODS) ×3 IMPLANT
KIT BASIN OR (CUSTOM PROCEDURE TRAY) ×3 IMPLANT
KIT ROOM TURNOVER OR (KITS) ×3 IMPLANT
MANIFOLD NEPTUNE II (INSTRUMENTS) ×3 IMPLANT
NS IRRIG 1000ML POUR BTL (IV SOLUTION) ×3 IMPLANT
PACK TOTAL JOINT (CUSTOM PROCEDURE TRAY) ×3 IMPLANT
PAD ARMBOARD 7.5X6 YLW CONV (MISCELLANEOUS) ×6 IMPLANT
SET HNDPC FAN SPRY TIP SCT (DISPOSABLE) IMPLANT
SPONGE LAP 4X18 X RAY DECT (DISPOSABLE) ×6 IMPLANT
SUT MNCRL AB 4-0 PS2 18 (SUTURE) IMPLANT
SUT VIC AB 1 CT1 27 (SUTURE) ×6
SUT VIC AB 1 CT1 27XBRD ANBCTR (SUTURE) ×2 IMPLANT
SUT VIC AB 2-0 CT1 27 (SUTURE)
SUT VIC AB 2-0 CT1 TAPERPNT 27 (SUTURE) IMPLANT
TOWEL OR 17X24 6PK STRL BLUE (TOWEL DISPOSABLE) ×3 IMPLANT
TOWEL OR 17X26 10 PK STRL BLUE (TOWEL DISPOSABLE) ×3 IMPLANT
TRAY FOLEY CATH 14FR (SET/KITS/TRAYS/PACK) IMPLANT
WATER STERILE IRR 1000ML POUR (IV SOLUTION) ×12 IMPLANT

## 2014-02-07 NOTE — Op Note (Signed)
NAME:  [name], [name]                ACCOUNT NO.:  [id number]   MEDICAL RECORD NO.: [id number]   LOCATION:  1435                         FACILITY:  [facility]   DATE OF BIRTH:  [birth year]  PHYSICIAN:  Madlyn Frankel. Charlann Boxer, M.D.     DATE OF PROCEDURE:  02/07/2014                               OPERATIVE REPORT     PREOPERATIVE DIAGNOSIS:  Left displaced femoral neck fracture.   POSTOPERATIVE DIAGNOSIS:  Left displaced femoral neck fracture.   PROCEDURE:  Left hip hemiarthroplasty utilizing DePuy component, size 4 standard Summit Basic stem with a 46mm unipolar ball with a +5 adapter.   SURGEON:  Madlyn Frankel. Charlann Boxer, MD   ASSISTANT:  Lanney Gins, PA-C.   ANESTHESIA:  General.   SPECIMENS:  None.   DRAINS:  None.   BLOOD LOSS:  About 150 cc.   COMPLICATIONS:  None.   INDICATION OF PROCEDURE:  [name] is a 80 year old female with mild dementia who lives Independently with her husband.  She unfortunately had a fall at her house when she slipped on ice after getting out of their car.  She was admitted to the hospital after radiographs revealed a femoral neck Fracture.  She had initially been sen at the [facility] before being transferred to [facility].  She was seen and evaluated and was scheduled for surgery for fixation.  The necessity of surgical repair was discussed with she and her family.  Consent was obtained after reviewing risks of infection, DVT, component failure, and need for revision surgery.   PROCEDURE IN DETAIL:  The patient was brought to the operative theater. Once adequate anesthesia, preoperative antibiotics, 2 g of Ancef administered, the patient was positioned into the right lateral decubitus position with the left side up.  The left lower extremity was then prepped and draped in sterile fashion.  A time-out was performed identifying the patient, planned procedure, and extremity.   A lateral incision was made off the proximal trochanter.  Sharp dissection was carried down to the iliotibial band and gluteal fascia. The gluteal fascia was then incised for posterior approach.  The short external rotators were taken down separate from the posterior capsule. An L capsulotomy was made preserving the posterior leaflet for later anatomic repair. Fracture site was identified and after removing comminuted segments of the posterior femoral neck, the femoral head was removed without difficulty and measured on the back table  using the sizing rings and determined to be 46 mm in diameter.   The proximal femur was then exposed.  Retractors placed.  I then drilled, opened the proximal femur.  Then I hand reamed once and  Irrigated the canal to try to prevent fat emboli.  I began broaching the femur with a size1 broach up to a size size 4 broach with good medial and lateral metaphyseal fit without evidence of any torsion or movement.  A trial reduction was carried out with a standard neck and at first the +0 then the +5 adapter with a 46mm ball.  The hip reduced nicely.  The leg lengths appeared to be equal compared to the down leg.   The hip went through a  range of motion without evidence of any subluxation or impingement.   Given these findings, the trial components removed.  The final 4 standard Summit Basic stem was opened.  After irrigating the canal, the final stem was impacted and sat at the level where the broach was. Based on this and the trial reduction, a +5 adapter was opened and impacted in the 46mm unipolar ball onto a clean and dry trunnion.  The hip had been irrigated throughout the case and again at this point.  I re- Approximated the posterior capsule to the superior leaflet using a  #1 Vicryl.  The remainder of the wound was closed with #1 Vicryl in the iliotibial band and gluteal fascia, a  2-0 Vicryl in the sub-Q tissue and a running 4-0 Monocryl in the skin.  The hip was cleaned, dried, and dressed sterilely using  Dermabond and Aquacel dressing.  She was then brought to recovery room, extubated in stable condition, tolerating the procedure well.  Lanney GinsMatthew Babish, PA-C was present and utilized as Geophysicist/field seismologistassistant for the entire case from  Preoperative positioning to management of the contralateral extremity and retractors to  General facilitation of the procedure.  He was also involved with primary wound closure.         Madlyn FrankelMatthew D. Charlann Boxerlin, M.D.

## 2014-02-07 NOTE — Transfer of Care (Signed)
Immediate Anesthesia Transfer of Care Note  Patient: [name]  Procedure(s) Performed: Procedure(s): ARTHROPLASTY BIPOLAR HIP (Left)  Patient Location: PACU  Anesthesia Type:General  Level of Consciousness: awake, alert  and oriented  Airway & Oxygen Therapy: Patient Spontanous Breathing and Patient connected to nasal cannula oxygen  Post-op Assessment: Report given to PACU RN and Post -op Vital signs reviewed and stable  Post vital signs: Reviewed and stable  Complications: No apparent anesthesia complications

## 2014-02-07 NOTE — Preoperative (Signed)
Beta Blockers   Reason not to administer Beta Blockers:Not Applicable 

## 2014-02-07 NOTE — Progress Notes (Signed)
Subjective: [name] is a very pleasant 80 year old patient of mine with mild dementia and GERD who I have cared for for many years. She slipped and fell on the ice and now has a fractured left hip. Plans are for surgery today. She is hemodynamically stable and is a good surgical risk.  Objective: Weight change:   Intake/Output Summary (Last 24 hours) at 02/07/14 0722 Last data filed at 02/07/14 0705  Gross per 24 hour  Intake 1136.67 ml  Output    800 ml  Net 336.67 ml   Filed Vitals:   02/07/14 0000 02/07/14 0140 02/07/14 0400 02/07/14 0546  BP:  142/63  155/71  Pulse:  79  76  Temp:  98.9 F (37.2 C)  98.8 F (37.1 C)  TempSrc:  Oral  Oral  Resp: _0 Height:      Weight:      SpO2:  96%  95%   General Appearance:  Alert, cooperative, no distress, appears stated age, slightly forgetful.   Head:  Normocephalic, without obvious abnormality, atraumatic   Throat:  Lips, mucosa, and tongue normal; teeth and gums normal   Neck:  Supple, no enlargement/tenderness/nodules; no carotid  bruit or JVD   Lungs:  Clear to auscultation bilaterally, respirations unlabored   Heart:  Regular rate and rhythm, S1 and S2 normal, no murmur, rub  or gallop      Abdomen:  Soft, non-tender, bowel sounds active all four quadrants,  no masses, no organomegaly   Extremities:  Left hip tender.good capillary refill distally in the toes bilaterally   Skin:  Skin color, texture, turgor normal, no rashes or lesions   Lymph nodes:  Cervical, supraclavicular, and axillary nodes normal   Neurologic:  CNII-XII intact, normal strength, sensation and reflexes  throughout. Moves all extremities well except left lower extremity secondary to pain   Psych: normal affect. Calm and cooperative     Lab Results: Results for orders placed during the hospital encounter of 02/06/14 (from the past 48 hour(s))  BASIC METABOLIC PANEL     Status: Abnormal   Collection Time    02/06/14  1:49 PM      Result  Value Ref Range   Sodium 143  137 - 147 mEq/L   Potassium 3.5 (*) 3.7 - 5.3 mEq/L   Chloride 105  96 - 112 mEq/L   CO2 23  19 - 32 mEq/L   Glucose, Bld 112 (*) 70 - 99 mg/dL   BUN 16  6 - 23 mg/dL   Creatinine, Ser 0.64  0.50 - 1.10 mg/dL   Calcium 9.2  8.4 - 10.5 mg/dL   GFR calc non Af Amer 82 (*) >90 mL/min   GFR calc Af Amer >90  >90 mL/min   Comment: (NOTE)     The eGFR has been calculated using the CKD EPI equation.     This calculation has not been validated in all clinical situations.     eGFR's persistently <90 mL/min signify possible Chronic Kidney     Disease.  CBC WITH DIFFERENTIAL     Status: None   Collection Time    02/06/14  1:49 PM      Result Value Ref Range   WBC 6.0  4.0 - 10.5 K/uL   RBC 4.86  3.87 - 5.11 MIL/uL   Hemoglobin 14.5  12.0 - 15.0 g/dL   HCT 42.8  36.0 - 46.0 %   MCV 88.1  78.0 - 100.0 fL  MCH 29.8  26.0 - 34.0 pg   MCHC 33.9  30.0 - 36.0 g/dL   RDW 13.2  11.5 - 15.5 %   Platelets 255  150 - 400 K/uL   Neutrophils Relative % 58  43 - 77 %   Neutro Abs 3.5  1.7 - 7.7 K/uL   Lymphocytes Relative 31  12 - 46 %   Lymphs Abs 1.9  0.7 - 4.0 K/uL   Monocytes Relative 9  3 - 12 %   Monocytes Absolute 0.5  0.1 - 1.0 K/uL   Eosinophils Relative 2  0 - 5 %   Eosinophils Absolute 0.1  0.0 - 0.7 K/uL   Basophils Relative 1  0 - 1 %   Basophils Absolute 0.0  0.0 - 0.1 K/uL  PROTIME-INR     Status: None   Collection Time    02/06/14  3:32 PM      Result Value Ref Range   Prothrombin Time 12.6  11.6 - 15.2 seconds   INR 0.96  0.00 - 1.49  TYPE AND SCREEN     Status: None   Collection Time    02/06/14  4:26 PM      Result Value Ref Range   ABO/RH(D) O POS     Antibody Screen NEG     Sample Expiration 02/09/2014    ABO/RH     Status: None   Collection Time    02/06/14  4:26 PM      Result Value Ref Range   ABO/RH(D) O POS    URINALYSIS, ROUTINE W REFLEX MICROSCOPIC     Status: Abnormal   Collection Time    02/06/14  8:17 PM      Result Value  Ref Range   Color, Urine YELLOW  YELLOW   APPearance CLEAR  CLEAR   Specific Gravity, Urine 1.028  1.005 - 1.030   pH 5.5  5.0 - 8.0   Glucose, UA 100 (*) NEGATIVE mg/dL   Hgb urine dipstick NEGATIVE  NEGATIVE   Bilirubin Urine SMALL (*) NEGATIVE   Ketones, ur 15 (*) NEGATIVE mg/dL   Protein, ur NEGATIVE  NEGATIVE mg/dL   Urobilinogen, UA 0.2  0.0 - 1.0 mg/dL   Nitrite NEGATIVE  NEGATIVE   Leukocytes, UA NEGATIVE  NEGATIVE   Comment: MICROSCOPIC NOT DONE ON URINES WITH NEGATIVE PROTEIN, BLOOD, LEUKOCYTES, NITRITE, OR GLUCOSE <1000 mg/dL.  SURGICAL PCR SCREEN     Status: None   Collection Time    02/07/14 12:22 AM      Result Value Ref Range   MRSA, PCR NEGATIVE  NEGATIVE   Staphylococcus aureus NEGATIVE  NEGATIVE   Comment:            The Xpert SA Assay (FDA     approved for NASAL specimens     in patients over 12 years of age),     is one component of     a comprehensive surveillance     program.  Test performance has     been validated by [employer] for patients greater     than or equal to 52 year old.     It is not intended     to diagnose infection nor to     guide or monitor treatment.    Studies/Results: Dg Chest 1 View  02/06/2014   CLINICAL DATA:  Fall, left hip fracture  EXAM: CHEST - 1 VIEW  COMPARISON:  06/19/2004  FINDINGS: Mild  cardiomegaly. Negative for CHF or pneumonia. Minor basilar atelectasis. No effusion or pneumothorax. Trachea midline. Atherosclerosis of the aorta.  IMPRESSION: Cardiomegaly without acute process   Electronically Signed   By: Daryll Brod M.D.   On: 02/06/2014 15:33   Dg Hip Complete Left  02/06/2014   CLINICAL DATA:  Fall.  Left hip pain.  EXAM: LEFT HIP - COMPLETE 2+ VIEW  COMPARISON:  None.  FINDINGS: Acute, impacted left femoral neck fracture is seen. No evidence of dislocation. No pelvic fracture identified.  IMPRESSION: Acute left femoral neck fracture.   Electronically Signed   By: Earle Gell M.D.   On: 02/06/2014 15:31    Medications: Scheduled Meds: . docusate sodium  100 mg Oral BID  . senna  1 tablet Oral BID   Continuous Infusions: . dextrose 5 % and 0.45 % NaCl with KCl 20 mEq/L 100 mL/hr at 02/07/14 0529   PRN Meds:.HYDROcodone-acetaminophen, methocarbamol (ROBAXIN) IV, methocarbamol, morphine injection  Assessment/Plan: Principal Problem:   Fracture of femoral neck, left - for left femoral neck surgery this afternoon per Dr. Alvan Dame Active Problems:   Dementia arising in the senium and presenium - chronic. May have more postoperative confusion than usual because of this   Fall due to ice or snow   GERD (gastroesophageal reflux disease) - symptomatically stable    LOS: 1 day   Henrine Screws, MD 02/07/2014, 7:22 AM

## 2014-02-07 NOTE — Anesthesia Preprocedure Evaluation (Addendum)
Anesthesia Evaluation  Patient identified by MRN, date of birth, ID band Patient awake    Reviewed: Allergy & Precautions, H&P , NPO status , Patient's Chart, lab work & pertinent test results  Airway Mallampati: II TM Distance: >3 FB Neck ROM: Full    Dental   Pulmonary  breath sounds clear to auscultation        Cardiovascular Rhythm:Regular Rate:Normal     Neuro/Psych Anxiety Dementia Remote cva CVA    GI/Hepatic GERD-  ,  Endo/Other    Renal/GU      Musculoskeletal   Abdominal   Peds  Hematology   Anesthesia Other Findings   Reproductive/Obstetrics                          Anesthesia Physical Anesthesia Plan  ASA: III  Anesthesia Plan: General   Post-op Pain Management:    Induction: Intravenous  Airway Management Planned: Oral ETT  Additional Equipment:   Intra-op Plan:   Post-operative Plan: Extubation in OR  Informed Consent: I have reviewed the patients History and Physical, chart, labs and discussed the procedure including the risks, benefits and alternatives for the proposed anesthesia with the patient or authorized representative who has indicated his/her understanding and acceptance.     Plan Discussed with: CRNA and Surgeon  Anesthesia Plan Comments:         Anesthesia Quick Evaluation

## 2014-02-07 NOTE — ED Provider Notes (Signed)
Medical screening examination/treatment/procedure(s) were conducted as a shared visit with non-physician practitioner(s) and myself.  I personally evaluated the patient during the encounter.   Date: 02/06/2014  Rate: 75  Rhythm: normal sinus rhythm  QRS Axis: normal  Intervals: PR shortened  ST/T Wave abnormalities: normal  Conduction Disutrbances:none  Narrative Interpretation: No ST or T wave changes consistent with ischemia.  Old EKG Reviewed: changes noted  I interviewed and examined the patient. Lungs are CTAB. Cardiac exam wnl. Abdomen soft.  Minimal ttp of left hip. Sensation intact in bilateral LE's. 2+ distal pulses. Found to have hip fx. Will admit.   Junius ArgyleForrest S Kina Shiffman, MD 02/07/14 1149

## 2014-02-07 NOTE — Anesthesia Procedure Notes (Addendum)
Procedure Name: Intubation Date/Time: 02/07/2014 5:55 PM Performed by: Angelica PouSMITH, Nannette Zill PIZZICARA Pre-anesthesia Checklist: Patient identified, Patient being monitored, Emergency Drugs available, Timeout performed and Suction available Patient Re-evaluated:Patient Re-evaluated prior to inductionOxygen Delivery Method: Circle system utilized Preoxygenation: Pre-oxygenation with 100% oxygen Intubation Type: IV induction Ventilation: Mask ventilation without difficulty Laryngoscope size: Mac 3 Gr IV view, GlideSc #4 Gr I view. Grade View: Grade I Tube type: Oral Tube size: 7.0 mm Number of attempts: 2 Airway Equipment and Method: Stylet and Video-laryngoscopy Placement Confirmation: ETT inserted through vocal cords under direct vision,  breath sounds checked- equal and bilateral and positive ETCO2 Secured at: 23 cm Tube secured with: Tape Dental Injury: Teeth and Oropharynx as per pre-operative assessment

## 2014-02-07 NOTE — Progress Notes (Signed)
Dr. Charlann Boxerlin at the bedside discussing plan of care/surgery with the patient and husband. Nursing will continue to monitor.

## 2014-02-07 NOTE — Consult Note (Signed)
Reason for Consult: Fall resulting in hip fracture Referring Physician:  Emergency room, [facility]  Rowena Moilanen is an 80 y.o. female.  HPI: 80 yo female slipped on ice after getting out of her car.  Immediate onset pain, inability to get up without help.  Unable to bear weight without pin.  EMS called and patient transported to [facility] ER.  Then transferred to [facility] admitted to Hospitalist service, Ortho consulted for definitive management.  Patient and family have been patients in our practice, know and respect Dr. Darrelyn Hillock. No other complaints of upper extremity pain or injury, no right hip pain   Past Medical History  Diagnosis Date  . Anxiety   . GERD (gastroesophageal reflux disease)   . Insomnia   . Memory loss   . Lumbago   . dementia   . Stroke 1985    no deficits    Past Surgical History  Procedure Laterality Date  . Tonsillectomy    . Adenoidectomy    . Cholecystectomy    . Total vaginal hysterectomy    . Appendectomy    . Abdominal hysterectomy      Family History  Problem Relation Age of Onset  . Cancer Mother 36  . Cancer Father 2  . Cancer Father     Social History:  reports that she has never smoked. She does not have any smokeless tobacco history on file. She reports that she does not drink alcohol or use illicit drugs.  Allergies: No Known Allergies  Medications:  I have reviewed the patient's current medications. Scheduled: . docusate sodium  100 mg Oral BID  . senna  1 tablet Oral BID    Results for orders placed during the hospital encounter of 02/06/14 (from the past 24 hour(s))  BASIC METABOLIC PANEL     Status: Abnormal   Collection Time    02/06/14  1:49 PM      Result Value Ref Range   Sodium 143  137 - 147 mEq/L   Potassium 3.5 (*) 3.7 - 5.3 mEq/L   Chloride 105  96 - 112 mEq/L   CO2 23  19 - 32 mEq/L   Glucose, Bld 112 (*) 70 - 99 mg/dL   BUN 16  6 - 23 mg/dL   Creatinine, Ser 6.96  0.50 - 1.10 mg/dL   Calcium 9.2  8.4 - 29.5 mg/dL   GFR calc non Af Amer 82 (*) >90 mL/min   GFR calc Af Amer >90  >90 mL/min  CBC WITH DIFFERENTIAL     Status: None   Collection Time    02/06/14  1:49 PM      Result Value Ref Range   WBC 6.0  4.0 - 10.5 K/uL   RBC 4.86  3.87 - 5.11 MIL/uL   Hemoglobin 14.5  12.0 - 15.0 g/dL   HCT 28.4  13.2 - 44.0 %   MCV 88.1  78.0 - 100.0 fL   MCH 29.8  26.0 - 34.0 pg   MCHC 33.9  30.0 - 36.0 g/dL   RDW 10.2  72.5 - 36.6 %   Platelets 255  150 - 400 K/uL   Neutrophils Relative % 58  43 - 77 %   Neutro Abs 3.5  1.7 - 7.7 K/uL   Lymphocytes Relative 31  12 - 46 %   Lymphs Abs 1.9  0.7 - 4.0 K/uL   Monocytes Relative 9  3 - 12 %   Monocytes Absolute 0.5  0.1 -  1.0 K/uL   Eosinophils Relative 2  0 - 5 %   Eosinophils Absolute 0.1  0.0 - 0.7 K/uL   Basophils Relative 1  0 - 1 %   Basophils Absolute 0.0  0.0 - 0.1 K/uL  PROTIME-INR     Status: None   Collection Time    02/06/14  3:32 PM      Result Value Ref Range   Prothrombin Time 12.6  11.6 - 15.2 seconds   INR 0.96  0.00 - 1.49  TYPE AND SCREEN     Status: None   Collection Time    02/06/14  4:26 PM      Result Value Ref Range   ABO/RH(D) O POS     Antibody Screen NEG     Sample Expiration 02/09/2014    ABO/RH     Status: None   Collection Time    02/06/14  4:26 PM      Result Value Ref Range   ABO/RH(D) O POS    URINALYSIS, ROUTINE W REFLEX MICROSCOPIC     Status: Abnormal   Collection Time    02/06/14  8:17 PM      Result Value Ref Range   Color, Urine YELLOW  YELLOW   APPearance CLEAR  CLEAR   Specific Gravity, Urine 1.028  1.005 - 1.030   pH 5.5  5.0 - 8.0   Glucose, UA 100 (*) NEGATIVE mg/dL   Hgb urine dipstick NEGATIVE  NEGATIVE   Bilirubin Urine SMALL (*) NEGATIVE   Ketones, ur 15 (*) NEGATIVE mg/dL   Protein, ur NEGATIVE  NEGATIVE mg/dL   Urobilinogen, UA 0.2  0.0 - 1.0 mg/dL   Nitrite NEGATIVE  NEGATIVE   Leukocytes, UA NEGATIVE  NEGATIVE  SURGICAL PCR SCREEN     Status: None    Collection Time    02/07/14 12:22 AM      Result Value Ref Range   MRSA, PCR NEGATIVE  NEGATIVE   Staphylococcus aureus NEGATIVE  NEGATIVE    X-ray: Displaced varus angulated left femoral neck fracture  ROS Reviewed at admission Early memory loss concerns No recent hospitalizations for colds, cough, heart issues  Blood pressure 155/71, pulse 76, temperature 98.8 F (37.1 C), temperature source Oral, resp. rate 18, height 5\' 6"  (1.676 m), weight 72.576 kg (160 lb), SpO2 95.00%.  Physical Exam Comfortable in hospital bed with husband at bedside Did not question her for memory issues  Left leg slightly shortened externally rotated, NVI, did not attempt to move Right lower extremity normal  Bilateral upper extremity normal without deformity, bruising General medical exam reviewed from HPI and normal  Assessment/Plan: Displaced left femoral neck fracture  NPO Consent ordered To OR today 2/23 for left hip hemiarthroplasty  Plan for 2-3 day hospital stay Her husband will want to take her home with home health PT as opposed to her going to SNF  Weight bearing, DVT prophylaxis, follow up will be noted, pain med Rxs to follow  Rashauna Tep D 02/07/2014, 10:00 AM

## 2014-02-07 NOTE — Anesthesia Postprocedure Evaluation (Signed)
Anesthesia Post Note  Patient: Jamie HatcherHelen Barber  Procedure(s) Performed: Procedure(s) (LRB): ARTHROPLASTY BIPOLAR HIP (Left)  Anesthesia type: General  Patient location: PACU  Post pain: Pain level controlled and Adequate analgesia  Post assessment: Post-op Vital signs reviewed, Patient's Cardiovascular Status Stable, Respiratory Function Stable, Patent Airway and Pain level controlled  Last Vitals:  Filed Vitals:   02/07/14 1945  BP: 147/79  Pulse: 131  Temp:   Resp: 14    Post vital signs: Reviewed and stable  Level of consciousness: awake, alert  and oriented  Complications: Tachycardia in the PACU treated with metoprolol.

## 2014-02-08 ENCOUNTER — Encounter (HOSPITAL_COMMUNITY): Payer: Self-pay | Admitting: Orthopedic Surgery

## 2014-02-08 DIAGNOSIS — I471 Supraventricular tachycardia, unspecified: Secondary | ICD-10-CM | POA: Diagnosis not present

## 2014-02-08 LAB — BASIC METABOLIC PANEL
BUN: 9 mg/dL (ref 6–23)
CO2: 26 mEq/L (ref 19–32)
Calcium: 8.4 mg/dL (ref 8.4–10.5)
Chloride: 104 mEq/L (ref 96–112)
Creatinine, Ser: 0.7 mg/dL (ref 0.50–1.10)
GFR, EST NON AFRICAN AMERICAN: 80 mL/min — AB (ref >90)
Glucose, Bld: 127 mg/dL — ABNORMAL HIGH (ref 70–99)
POTASSIUM: 4.1 meq/L (ref 3.7–5.3)
SODIUM: 141 meq/L (ref 137–147)

## 2014-02-08 LAB — CBC
HCT: 38 % (ref 36.0–46.0)
Hemoglobin: 12.8 g/dL (ref 12.0–15.0)
MCH: 30 pg (ref 26.0–34.0)
MCHC: 33.7 g/dL (ref 30.0–36.0)
MCV: 89 fL (ref 78.0–100.0)
Platelets: 222 10*3/uL (ref 150–400)
RBC: 4.27 MIL/uL (ref 3.87–5.11)
RDW: 13.4 % (ref 11.5–15.5)
WBC: 8.8 10*3/uL (ref 4.0–10.5)

## 2014-02-08 MED ORDER — ONDANSETRON HCL 4 MG/2ML IJ SOLN: 4.0000 mg | Freq: Four times a day (QID) | INTRAMUSCULAR | Status: DC | PRN

## 2014-02-08 MED ORDER — FLEET ENEMA 7-19 GM/118ML RE ENEM: 1.0000 | ENEMA | Freq: Once | RECTAL | Status: AC | PRN

## 2014-02-08 MED ORDER — CEFAZOLIN SODIUM-DEXTROSE 2-3 GM-% IV SOLR
2.0000 g | Freq: Four times a day (QID) | INTRAVENOUS | Status: AC
  Administered 2014-02-08 (×2): 2 g via INTRAVENOUS
  Filled 2014-02-08 (×2): qty 50

## 2014-02-08 MED ORDER — ONDANSETRON HCL 4 MG PO TABS: 4.0000 mg | ORAL_TABLET | Freq: Four times a day (QID) | ORAL | Status: DC | PRN

## 2014-02-08 MED ORDER — POLYETHYLENE GLYCOL 3350 17 G PO PACK
17.0000 g | PACK | Freq: Two times a day (BID) | ORAL | Status: DC
  Administered 2014-02-08 – 2014-02-09 (×4): 17 g via ORAL
  Filled 2014-02-08 (×8): qty 1

## 2014-02-08 MED ORDER — METOCLOPRAMIDE HCL 5 MG/ML IJ SOLN: 5.0000 mg | Freq: Three times a day (TID) | INTRAMUSCULAR | Status: DC | PRN

## 2014-02-08 MED ORDER — FERROUS SULFATE 325 (65 FE) MG PO TABS
325.0000 mg | ORAL_TABLET | Freq: Three times a day (TID) | ORAL | Status: DC
  Administered 2014-02-08 – 2014-02-11 (×9): 325 mg via ORAL
  Filled 2014-02-08 (×13): qty 1

## 2014-02-08 MED ORDER — METOCLOPRAMIDE HCL 10 MG PO TABS: 5.0000 mg | ORAL_TABLET | Freq: Three times a day (TID) | ORAL | Status: DC | PRN

## 2014-02-08 MED ORDER — ASPIRIN EC 325 MG PO TBEC
325.0000 mg | DELAYED_RELEASE_TABLET | Freq: Two times a day (BID) | ORAL | Status: DC
Start: 2014-02-08 — End: 2014-02-11
  Administered 2014-02-08 – 2014-02-11 (×7): 325 mg via ORAL
  Filled 2014-02-08 (×9): qty 1

## 2014-02-08 MED ORDER — PHENOL 1.4 % MT LIQD: 1.0000 | OROMUCOSAL | Status: DC | PRN

## 2014-02-08 MED ORDER — BISACODYL 10 MG RE SUPP: 10.0000 mg | Freq: Every day | RECTAL | Status: DC | PRN

## 2014-02-08 MED ORDER — MENTHOL 3 MG MT LOZG: 1.0000 | LOZENGE | OROMUCOSAL | Status: DC | PRN

## 2014-02-08 NOTE — Evaluation (Signed)
Physical Therapy Evaluation Patient Details Name: [name] MRN: [id number] DOB: 1932-06-13 Today's Date: 02/08/2014 Time: 2725-3664 PT Time Calculation (min): 30 min  PT Assessment / Plan / Recommendation History of Present Illness  Pt is an 79 y/o female admitted s/p fall on ice, sustaining a femur fracture. Pt is now s/p L hip hemiarthroplasty and is PWB 50%.  Clinical Impression  This patient presents with acute pain and decreased functional independence following the above mentioned procedure. At the time of PT eval, pt was able to perform transfers and ambulation with assist. Pt is very motivated to return home, however spoke with pt and family about possibility of SNF if significant progress is not made by d/c.      PT Assessment  Patient needs continued PT services    Follow Up Recommendations  Home health PT;Supervision/Assistance - 24 hour    Does the patient have the potential to tolerate intense rehabilitation      Barriers to Discharge        Equipment Recommendations  3in1 (PT)    Recommendations for Other Services     Frequency Min 5X/week    Precautions / Restrictions Precautions Precautions: Fall;Posterior Hip Precaution Booklet Issued: Yes (comment) Precaution Comments: Discussed 3/3 hip precautions with pt and husband Restrictions Weight Bearing Restrictions: Yes LLE Weight Bearing: Partial weight bearing LLE Partial Weight Bearing Percentage or Pounds: 50   Pertinent Vitals/Pain       Mobility  Bed Mobility Overal bed mobility: Needs Assistance Bed Mobility: Supine to Sit Supine to sit: Min assist General bed mobility comments: VC's for sequencing and technique. Hand-over-hand required for bed rail use for support. Assist for movement of LLE as pt transitions to EOB.  Transfers Overall transfer level: Needs assistance Equipment used: Rolling walker (2 wheeled) Transfers: Sit to/from Stand Sit to Stand: Min assist General transfer comment:  VC's for hand placement on seated surface for safety. Assist to power-up to full stand.  Ambulation/Gait Ambulation/Gait assistance: Min assist Ambulation Distance (Feet): 45 Feet Assistive device: Rolling walker (2 wheeled) Gait Pattern/deviations: Step-to pattern;Decreased stride length Gait velocity: Decreased Gait velocity interpretation: Below normal speed for age/gender General Gait Details: VC's for sequencing and safety awareness with RW. Occasional assist for walker placement. Pt took one seated rest break in recliner after 20 feet, and appeared to really push herself to ambulate further.     Exercises General Exercises - Lower Extremity Ankle Circles/Pumps: 10 reps Quad Sets: 10 reps   PT Diagnosis: Difficulty walking;Acute pain  PT Problem List: Decreased strength;Decreased range of motion;Decreased activity tolerance;Decreased balance;Decreased mobility;Decreased knowledge of use of DME;Decreased safety awareness;Decreased knowledge of precautions;Pain PT Treatment Interventions: DME instruction;Gait training;Stair training;Functional mobility training;Therapeutic activities;Therapeutic exercise;Neuromuscular re-education;Patient/family education     PT Goals(Current goals can be found in the care plan section) Acute Rehab PT Goals Patient Stated Goal: Pt wants to return home today. PT Goal Formulation: With patient/family Time For Goal Achievement: 02/22/14 Potential to Achieve Goals: Good  Visit Information  Last PT Received On: 02/08/14 Assistance Needed: +2 (chair follow) History of Present Illness: Pt is an 80 y/o female admitted s/p fall on ice, sustaining a femur fracture. Pt is now s/p L hip hemiarthroplasty and is PWB 50%.       Prior Functioning  Home Living Family/patient expects to be discharged to:: Private residence Living Arrangements: Spouse/significant other Available Help at Discharge: Family;Available 24 hours/day Type of Home: House Home Access:  Stairs to enter [employer] of Steps: 2 Entrance Stairs-Rails:  None Home Layout: One level Home Equipment: Crutches;Walker - 2 wheels;Cane - single point;Wheelchair - manual;Grab bars - tub/shower;Grab bars - toilet Prior Function Level of Independence: Independent Communication Communication: No difficulties Dominant Hand: Left    Cognition  Cognition Arousal/Alertness: Awake/alert Behavior During Therapy: WFL for tasks assessed/performed Overall Cognitive Status: Within Functional Limits for tasks assessed    Extremity/Trunk Assessment Upper Extremity Assessment Upper Extremity Assessment: Overall WFL for tasks assessed Lower Extremity Assessment Lower Extremity Assessment: LLE deficits/detail LLE Deficits / Details: Decreased strength and AROM consistent with above mentioned procedure.  LLE: Unable to fully assess due to pain Cervical / Trunk Assessment Cervical / Trunk Assessment: Kyphotic   Balance Balance Overall balance assessment: History of Falls  End of Session PT - End of Session Equipment Utilized During Treatment: Gait belt Activity Tolerance: Patient limited by pain Patient left: in chair;with call bell/phone within reach;with family/visitor present Nurse Communication: Mobility status  GP     Ruthann CancerHamilton, Qiara Minetti 02/08/2014, 12:51 PM  Ruthann CancerLaura Hamilton, PT, DPT [phone]

## 2014-02-08 NOTE — Evaluation (Signed)
Occupational Therapy Evaluation Patient Details Name: [name] MRN: [id number] DOB: [birth year] Today's Date: 02/08/2014 Time: 6269-4854 OT Time Calculation (min): 26 min  OT Assessment / Plan / Recommendation History of present illness Pt is an 80 y/o female admitted s/p fall on ice, sustaining a femur fracture. Pt is now s/p L hip hemiarthroplasty and is PWB 50%.   Clinical Impression   Pt presents with below problem list. Pt independent with ADLs, PTA. Feel pt will benefit from acute OT to increase independence prior to d/c.     OT Assessment  Patient needs continued OT Services    Follow Up Recommendations  Home health OT;Supervision/Assistance - 24 hour    Barriers to Discharge      Equipment Recommendations  3 in 1 bedside comode;Other (comment) (may benefit from AE kit)    Recommendations for Other Services    Frequency  Min 2X/week    Precautions / Restrictions Precautions Precautions: Fall;Posterior Hip Precaution Booklet Issued: No Precaution Comments: Reviewed precautions with pt and spouse present Restrictions Weight Bearing Restrictions: Yes LLE Weight Bearing: Partial weight bearing LLE Partial Weight Bearing Percentage or Pounds: 50   Pertinent Vitals/Pain No pain reported.     ADL  Eating/Feeding: Independent Where Assessed - Eating/Feeding: Chair Grooming: Min guard;Wash/dry hands Where Assessed - Grooming: Supported standing Upper Body Bathing: Set up;Supervision/safety Where Assessed - Upper Body Bathing: Supported sitting Lower Body Bathing: Moderate assistance Where Assessed - Lower Body Bathing: Supported sit to stand Upper Body Dressing: Set up;Supervision/safety Where Assessed - Upper Body Dressing: Supported sitting Lower Body Dressing: Moderate assistance Where Assessed - Lower Body Dressing: Supported sit to Lobbyist: Minimal assistance Armed forces technical officer Method: Sit to Loss adjuster, chartered: Raised toilet seat  with arms (or 3-in-1 over toilet) Toileting - Clothing Manipulation and Hygiene: Minimal assistance Where Assessed - Camera operator Manipulation and Hygiene: Standing;Sit on 3-in-1 or toilet Tub/Shower Transfer Method: Not assessed Equipment Used: Gait belt;Rolling walker;Reacher;Long-handled sponge;Sock aid Transfers/Ambulation Related to ADLs: Mod A for ambulation. Min A for transfers. ADL Comments: Educated on AE for LB ADLs. Pt practiced donning/doffing sock with sockaid. Husband states he will assist with ADLs, but when asking pt she acts like she would like to be able to do some for herself. Discussed tub transfer techniques with spouse. Cues for precautions during session.    OT Diagnosis: Acute pain;Generalized weakness  OT Problem List: Decreased strength;Decreased activity tolerance;Impaired balance (sitting and/or standing);Decreased knowledge of use of DME or AE;Decreased knowledge of precautions;Decreased cognition;Pain OT Treatment Interventions: Self-care/ADL training;DME and/or AE instruction;Therapeutic activities;Patient/family education;Balance training   OT Goals(Current goals can be found in the care plan section) Acute Rehab OT Goals Patient Stated Goal: go home OT Goal Formulation: With patient/family Time For Goal Achievement: 02/15/14 Potential to Achieve Goals: Good ADL Goals Pt Will Perform Grooming: with supervision;standing Pt Will Perform Lower Body Dressing: with supervision;sit to/from stand;with adaptive equipment;with caregiver independent in assisting Pt Will Transfer to Toilet: with supervision;ambulating Pt Will Perform Toileting - Clothing Manipulation and hygiene: with supervision;sit to/from stand Additional ADL Goal #1: Pt/spouse will be able to verbalize 3/3 hip precautions.  Visit Information  Last OT Received On: 02/08/14 Assistance Needed: +2 (chair follow) History of Present Illness: Pt is an 80 y/o female admitted s/p fall on ice,  sustaining a femur fracture. Pt is now s/p L hip hemiarthroplasty and is PWB 50%.       Prior Functioning     Home Living Family/patient expects to be discharged  to:: Private residence Living Arrangements: Spouse/significant other Available Help at Discharge: Family;Available 24 hours/day Type of Home: House Home Access: Stairs to enter [employer] of Steps: 2 Entrance Stairs-Rails: None Home Layout: One level Home Equipment: Crutches;Walker - 2 wheels;Cane - single point;Wheelchair - manual;Grab bars - tub/shower;Grab bars - toilet Prior Function Level of Independence: Independent Communication Communication: No difficulties Dominant Hand: Left         Vision/Perception     Cognition  Cognition Arousal/Alertness: Awake/alert Behavior During Therapy: WFL for tasks assessed/performed Overall Cognitive Status: History of cognitive impairments - at baseline    Extremity/Trunk Assessment Upper Extremity Assessment Upper Extremity Assessment: Overall WFL for tasks assessed Lower Extremity Assessment Lower Extremity Assessment: Defer to PT evaluation     Mobility Transfers Overall transfer level: Needs assistance Equipment used: Rolling walker (2 wheeled) Transfers: Sit to/from Stand Sit to Stand: Min assist General transfer comment: cues for technique and assistance for positioning of LLE.     Exercise     Balance     End of Session OT - End of Session Equipment Utilized During Treatment: Gait belt;Rolling walker Activity Tolerance: Patient tolerated treatment well Patient left: in chair;with family/visitor present (spouse had call bell and phone)  GO     Benito Mccreedy OTR/L [phone] 02/08/2014, 5:29 PM

## 2014-02-08 NOTE — Progress Notes (Signed)
Physical Therapy Treatment Patient Details Name: Jamie HatcherHelen Barber MRN: [id number] DOB: [birth year] Today's Date: 02/08/2014 Time: 1635 - 1643       Issued pt HEP handout and went over some of the exercises - will need review next session. Discussed positioning and posture while in recliner, as well as pt/family education regarding process for deciding home vs. SNF at d/c - pt and husband appear very anxious regarding discharge planning.  Ruthann CancerHamilton, Daegon Deiss 02/08/2014, 4:49 PM  Ruthann CancerLaura Hamilton, PT, DPT [phone]

## 2014-02-08 NOTE — Progress Notes (Signed)
Orthopedic Tech Progress Note Patient Details:  [name] 10-02-1932 [id number]  Patient ID: Jamie HatcherHelen Barber, female   DOB: 10-02-1932, [age] y.o.   MRN: [id number] rn stated that pt moves satisfactorily without ohf; therefore it will not be provided at this time  Nikki DomCrawford, Tylah Mancillas 02/08/2014, 1:09 PM

## 2014-02-08 NOTE — Progress Notes (Addendum)
Subjective: Patient did well postoperatively with transient tachycardia after surgery. Responded nicely to beta blocker.  Objective: Weight change:   Intake/Output Summary (Last 24 hours) at 02/08/14 1112 Last data filed at 02/08/14 0843  Gross per 24 hour  Intake   1320 ml  Output   2610 ml  Net  -1290 ml   Filed Vitals:   02/07/14 2035 02/07/14 2301 02/08/14 0244 02/08/14 0646  BP:  147/85 150/73 148/62  Pulse: 101 100 92 85  Temp:  98.4 F (36.9 C) 99.3 F (37.4 C) 98.4 F (36.9 C)  TempSrc:  Oral Oral Oral  Resp: 15 18 18 18   Height:      Weight:      SpO2: 94% 96% 95% 96%    General Appearance: Alert, cooperative, no distress, appears stated age Lungs: Clear to auscultation bilaterally, respirations unlabored Heart: Regular rate and rhythm, S1 and S2 normal, no murmur, rub or gallop Abdomen: Soft, non-tender, bowel sounds active all four quadrants, no masses, no organomegaly Extremities: Both lower extremities without significant edema. Wound dressing over left that is dry with no surrounding erythema or significant bruising Neuro: Oriented to name and place, nonfocal  Lab Results: Results for orders placed during the hospital encounter of 02/06/14 (from the past 48 hour(s))  BASIC METABOLIC PANEL     Status: Abnormal   Collection Time    02/06/14  1:49 PM      Result Value Ref Range   Sodium 143  137 - 147 mEq/L   Potassium 3.5 (*) 3.7 - 5.3 mEq/L   Chloride 105  96 - 112 mEq/L   CO2 23  19 - 32 mEq/L   Glucose, Bld 112 (*) 70 - 99 mg/dL   BUN 16  6 - 23 mg/dL   Creatinine, Ser 0.64  0.50 - 1.10 mg/dL   Calcium 9.2  8.4 - 10.5 mg/dL   GFR calc non Af Amer 82 (*) >90 mL/min   GFR calc Af Amer >90  >90 mL/min   Comment: (NOTE)     The eGFR has been calculated using the CKD EPI equation.     This calculation has not been validated in all clinical situations.     eGFR's persistently <90 mL/min signify possible Chronic Kidney     Disease.  CBC WITH  DIFFERENTIAL     Status: None   Collection Time    02/06/14  1:49 PM      Result Value Ref Range   WBC 6.0  4.0 - 10.5 K/uL   RBC 4.86  3.87 - 5.11 MIL/uL   Hemoglobin 14.5  12.0 - 15.0 g/dL   HCT 42.8  36.0 - 46.0 %   MCV 88.1  78.0 - 100.0 fL   MCH 29.8  26.0 - 34.0 pg   MCHC 33.9  30.0 - 36.0 g/dL   RDW 13.2  11.5 - 15.5 %   Platelets 255  150 - 400 K/uL   Neutrophils Relative % 58  43 - 77 %   Neutro Abs 3.5  1.7 - 7.7 K/uL   Lymphocytes Relative 31  12 - 46 %   Lymphs Abs 1.9  0.7 - 4.0 K/uL   Monocytes Relative 9  3 - 12 %   Monocytes Absolute 0.5  0.1 - 1.0 K/uL   Eosinophils Relative 2  0 - 5 %   Eosinophils Absolute 0.1  0.0 - 0.7 K/uL   Basophils Relative 1  0 - 1 %   Basophils  Absolute 0.0  0.0 - 0.1 K/uL  PROTIME-INR     Status: None   Collection Time    02/06/14  3:32 PM      Result Value Ref Range   Prothrombin Time 12.6  11.6 - 15.2 seconds   INR 0.96  0.00 - 1.49  TYPE AND SCREEN     Status: None   Collection Time    02/06/14  4:26 PM      Result Value Ref Range   ABO/RH(D) O POS     Antibody Screen NEG     Sample Expiration 02/09/2014    ABO/RH     Status: None   Collection Time    02/06/14  4:26 PM      Result Value Ref Range   ABO/RH(D) O POS    URINALYSIS, ROUTINE W REFLEX MICROSCOPIC     Status: Abnormal   Collection Time    02/06/14  8:17 PM      Result Value Ref Range   Color, Urine YELLOW  YELLOW   APPearance CLEAR  CLEAR   Specific Gravity, Urine 1.028  1.005 - 1.030   pH 5.5  5.0 - 8.0   Glucose, UA 100 (*) NEGATIVE mg/dL   Hgb urine dipstick NEGATIVE  NEGATIVE   Bilirubin Urine SMALL (*) NEGATIVE   Ketones, ur 15 (*) NEGATIVE mg/dL   Protein, ur NEGATIVE  NEGATIVE mg/dL   Urobilinogen, UA 0.2  0.0 - 1.0 mg/dL   Nitrite NEGATIVE  NEGATIVE   Leukocytes, UA NEGATIVE  NEGATIVE   Comment: MICROSCOPIC NOT DONE ON URINES WITH NEGATIVE PROTEIN, BLOOD, LEUKOCYTES, NITRITE, OR GLUCOSE <1000 mg/dL.  URINE CULTURE     Status: None   Collection  Time    02/06/14  8:18 PM      Result Value Ref Range   Specimen Description URINE, CATHETERIZED     Special Requests NONE     Culture  Setup Time       Value: 02/07/2014 02:31     Performed at [facility]       Value: NO GROWTH     Performed at [facility]   Culture       Value: NO GROWTH     Performed at [facility]   Report Status 02/07/2014 FINAL    SURGICAL PCR SCREEN     Status: None   Collection Time    02/07/14 12:22 AM      Result Value Ref Range   MRSA, PCR NEGATIVE  NEGATIVE   Staphylococcus aureus NEGATIVE  NEGATIVE   Comment:            The Xpert SA Assay (FDA     approved for NASAL specimens     in patients over 48 years of age),     is one component of     a comprehensive surveillance     program.  Test performance has     been validated by [employer] for patients greater     than or equal to 100 year old.     It is not intended     to diagnose infection nor to     guide or monitor treatment.  CBC     Status: None   Collection Time    02/08/14  4:51 AM      Result Value Ref Range   WBC 8.8  4.0 - 10.5 K/uL   RBC 4.27  3.87 - 5.11 MIL/uL   Hemoglobin 12.8  12.0 - 15.0 g/dL   HCT 38.0  36.0 - 46.0 %   MCV 89.0  78.0 - 100.0 fL   MCH 30.0  26.0 - 34.0 pg   MCHC 33.7  30.0 - 36.0 g/dL   RDW 13.4  11.5 - 15.5 %   Platelets 222  150 - 400 K/uL  BASIC METABOLIC PANEL     Status: Abnormal   Collection Time    02/08/14  4:51 AM      Result Value Ref Range   Sodium 141  137 - 147 mEq/L   Potassium 4.1  3.7 - 5.3 mEq/L   Chloride 104  96 - 112 mEq/L   CO2 26  19 - 32 mEq/L   Glucose, Bld 127 (*) 70 - 99 mg/dL   BUN 9  6 - 23 mg/dL   Creatinine, Ser 0.70  0.50 - 1.10 mg/dL   Calcium 8.4  8.4 - 10.5 mg/dL   GFR calc non Af Amer 80 (*) >90 mL/min   GFR calc Af Amer >90  >90 mL/min   Comment: (NOTE)     The eGFR has been calculated using the CKD EPI equation.     This calculation has not been validated in all  clinical situations.     eGFR's persistently <90 mL/min signify possible Chronic Kidney     Disease.    Studies/Results: Dg Chest 1 View  02/06/2014   CLINICAL DATA:  Fall, left hip fracture  EXAM: CHEST - 1 VIEW  COMPARISON:  06/19/2004  FINDINGS: Mild cardiomegaly. Negative for CHF or pneumonia. Minor basilar atelectasis. No effusion or pneumothorax. Trachea midline. Atherosclerosis of the aorta.  IMPRESSION: Cardiomegaly without acute process   Electronically Signed   By: Daryll Brod M.D.   On: 02/06/2014 15:33   Dg Hip Complete Left  02/06/2014   CLINICAL DATA:  Fall.  Left hip pain.  EXAM: LEFT HIP - COMPLETE 2+ VIEW  COMPARISON:  None.  FINDINGS: Acute, impacted left femoral neck fracture is seen. No evidence of dislocation. No pelvic fracture identified.  IMPRESSION: Acute left femoral neck fracture.   Electronically Signed   By: Earle Gell M.D.   On: 02/06/2014 15:31   Dg Pelvis Portable  02/08/2014   CLINICAL DATA:  Left hip replacement  EXAM: PORTABLE PELVIS 1-2 VIEWS  COMPARISON:  02/06/2014  FINDINGS: Left hip hemiarthroplasty identified.  A prosthesis is located on the frontal views.  No complicating features are identified.  IMPRESSION: Left hip hemiarthroplasty without complicating features.   Electronically Signed   By: Hassan Rowan M.D.   On: 02/08/2014 01:35   Medications: Scheduled Meds: . aspirin EC  325 mg Oral BID  . docusate sodium  100 mg Oral BID  . ferrous sulfate  325 mg Oral TID PC  . polyethylene glycol  17 g Oral BID  . senna  1 tablet Oral BID   Continuous Infusions: . dextrose 5 % and 0.45 % NaCl with KCl 20 mEq/L 100 mL/hr at 02/08/14 0453  . lactated ringers 50 mL/hr at 02/07/14 1614   PRN Meds:.bisacodyl, HYDROcodone-acetaminophen, LORazepam, LORazepam, menthol-cetylpyridinium, methocarbamol (ROBAXIN) IV, methocarbamol, metoCLOPramide (REGLAN) injection, metoCLOPramide, morphine injection, ondansetron (ZOFRAN) IV, ondansetron, phenol, sodium  phosphate  Assessment/Plan: Principal Problem:   Fracture of femoral neck, left - as per orthopedics. Patient trying to transition home with home health physical therapy later this week Active Problems:   Dementia arising in the senium and presenium -  stable and tolerated surgery well   Fall due to ice or snow   GERD (gastroesophageal reflux disease) - medically stable   Postoperative tachycardia - resolved with beta blocker therapy    LOS: 2 days   Henrine Screws, MD 02/08/2014, 11:12 AM

## 2014-02-09 LAB — CBC
HCT: 34.3 % — ABNORMAL LOW (ref 36.0–46.0)
Hemoglobin: 11.4 g/dL — ABNORMAL LOW (ref 12.0–15.0)
MCH: 29.5 pg (ref 26.0–34.0)
MCHC: 33.2 g/dL (ref 30.0–36.0)
MCV: 88.9 fL (ref 78.0–100.0)
Platelets: 202 10*3/uL (ref 150–400)
RBC: 3.86 MIL/uL — AB (ref 3.87–5.11)
RDW: 13.3 % (ref 11.5–15.5)
WBC: 8.5 10*3/uL (ref 4.0–10.5)

## 2014-02-09 LAB — BASIC METABOLIC PANEL
BUN: 11 mg/dL (ref 6–23)
CO2: 25 mEq/L (ref 19–32)
Calcium: 8.4 mg/dL (ref 8.4–10.5)
Chloride: 103 mEq/L (ref 96–112)
Creatinine, Ser: 0.7 mg/dL (ref 0.50–1.10)
GFR calc non Af Amer: 80 mL/min — ABNORMAL LOW (ref >90)
Glucose, Bld: 123 mg/dL — ABNORMAL HIGH (ref 70–99)
POTASSIUM: 3.9 meq/L (ref 3.7–5.3)
SODIUM: 140 meq/L (ref 137–147)

## 2014-02-09 NOTE — Progress Notes (Signed)
Patient ID: Jamie HatcherHelen Barber, female   DOB: 10/12/1932, 80 y.o.   MRN: [id number] Subjective: 2 Days Post-Op Procedure(s) (LRB): ARTHROPLASTY BIPOLAR HIP (Left)    Patient reports pain as mild.  Husband reports a wild night of anxiety and activity calmed with medication  Objective:   VITALS:   Filed Vitals:   02/09/14 0526  BP: 141/70  Pulse: 104  Temp: 98.4 F (36.9 C)  Resp: 18    Neurovascular intact Incision: dressing C/D/I  LABS  Recent Labs  02/06/14 1349 02/08/14 0451 02/09/14 0414  HGB 14.5 12.8 11.4*  HCT 42.8 38.0 34.3*  WBC 6.0 8.8 8.5  PLT 255 222 202     Recent Labs  02/06/14 1349 02/08/14 0451 02/09/14 0414  NA 143 141 140  K 3.5* 4.1 3.9  BUN 16 9 11   CREATININE 0.64 0.70 0.70  GLUCOSE 112* 127* 123*     Recent Labs  02/06/14 1532  INR 0.96     Assessment/Plan: 2 Days Post-Op Procedure(s) (LRB): ARTHROPLASTY BIPOLAR HIP (Left)   Up with therapy Plan for discharge tomorrow  Work again with therapy today to determine level of safety Discussions with family about best disposition  Rx on chart for pain meds (NORCO) unless changed by primary team Follow up in 2 weeks, [facility], [place], [phone] PWB 50% LLE Maintain surgical dressing until follow up appointment

## 2014-02-09 NOTE — Progress Notes (Signed)
Physical Therapy Treatment Patient Details Name: [name] MRN: [id number] DOB: [birth year] Today's Date: 02/09/2014 Time: [id number] PT Time Calculation (min): 24 min  PT Assessment / Plan / Recommendation  History of Present Illness Pt is progressing well post op, but has decreased safety with RW   PT Comments   Pt needs frequent cues for safety   Follow Up Recommendations  Home health PT;Supervision/Assistance - 24 hour     Does the patient have the potential to tolerate intense rehabilitation     Barriers to Discharge        Equipment Recommendations  3in1 (PT)    Recommendations for Other Services    Frequency Min 5X/week   Progress towards PT Goals Progress towards PT goals: Progressing toward goals  Plan      Precautions / Restrictions Precautions Precautions: Fall;Posterior Hip Precaution Booklet Issued: No Precaution Comments: Reviewed precautions with pt and spouse present Restrictions Weight Bearing Restrictions: Yes LLE Weight Bearing: Partial weight bearing LLE Partial Weight Bearing Percentage or Pounds: 50   Pertinent Vitals/Pain No c/o pain in LLE, demonstration of fatigue after walking    Mobility  Bed Mobility General bed mobility comments: pt sitting up in chair Transfers Overall transfer level: Needs assistance Equipment used: Standard walker Transfers: Sit to/from Stand Sit to Stand: Min guard General transfer comment: needs cues for safety and to use walker correctly. Pt makes attempt to limit weight bearing on LLE.  Pt to bathroom to void and stand to wash hands, but needed assit to keep walker in correct position Ambulation/Gait Ambulation/Gait assistance: Min assist Ambulation Distance (Feet): 100 Feet Assistive device: Rolling walker (2 wheeled) Gait Pattern/deviations: Step-to pattern;Decreased stride length Gait velocity: Decreased General Gait Details: Pt does not use walker safely while walking in bathroom, room or preparing to sit  >< stand    Exercises     PT Diagnosis:    PT Problem List:   PT Treatment Interventions:     PT Goals (current goals can now be found in the care plan section)    Visit Information  Last PT Received On: 02/09/14 Assistance Needed: +1 History of Present Illness: Pt is progressing well post op, but has decreased safety with RW    Subjective Data      Cognition  Cognition Arousal/Alertness: Awake/alert Behavior During Therapy: WFL for tasks assessed/performed Overall Cognitive Status: History of cognitive impairments - at baseline    Balance  Balance Overall balance assessment: Needs assistance (pt tends to be impulsive; moves without supervision, even when reminded) General Comments General comments (skin integrity, edema, etc.): Husband in room and reports that pt does not wait for assistance to get up.  Chair alarm placed.  Provided reinforcement for safety to patient  End of Session PT - End of Session Equipment Utilized During Treatment: Gait belt Activity Tolerance: Patient limited by pain Patient left: in chair;with call bell/phone within reach;with family/visitor present Nurse Communication: Mobility status   GP    Rosey Batheresa K. SalamancaBrown, South CarolinaPT [phone] 02/09/2014, 4:14 PM

## 2014-02-09 NOTE — Progress Notes (Signed)
Subjective: [name] had some anxiety last night but covered well with lorazepam  Objective: Weight change:   Intake/Output Summary (Last 24 hours) at 02/09/14 0758 Last data filed at 02/09/14 0650  Gross per 24 hour  Intake   1040 ml  Output    200 ml  Net    840 ml   Filed Vitals:   02/08/14 2000 02/08/14 2012 02/09/14 0000 02/09/14 0526  BP:  152/60  141/70  Pulse:  110  104  Temp:  97.9 F (36.6 C)  98.4 F (36.9 C)  TempSrc:  Oral  Oral  Resp: 18 18 20 18   Height:      Weight:      SpO2:  99%  93%   General Appearance: Alert, cooperative, no distress, appears stated age  Lungs: Clear to auscultation bilaterally, respirations unlabored  Heart: Regular rate and rhythm, S1 and S2 normal, no murmur, rub or gallop  Abdomen: Soft, non-tender, bowel sounds active all four quadrants, no masses, no organomegaly  Extremities: Both lower extremities without significant edema. Wound dressing over left that is dry with no surrounding erythema or significant bruising  Neuro: Oriented to name and place, nonfocal  Lab Results: Results for orders placed during the hospital encounter of 02/06/14 (from the past 48 hour(s))  CBC     Status: None   Collection Time    02/08/14  4:51 AM      Result Value Ref Range   WBC 8.8  4.0 - 10.5 K/uL   RBC 4.27  3.87 - 5.11 MIL/uL   Hemoglobin 12.8  12.0 - 15.0 g/dL   HCT 38.0  36.0 - 46.0 %   MCV 89.0  78.0 - 100.0 fL   MCH 30.0  26.0 - 34.0 pg   MCHC 33.7  30.0 - 36.0 g/dL   RDW 13.4  11.5 - 15.5 %   Platelets 222  150 - 400 K/uL  BASIC METABOLIC PANEL     Status: Abnormal   Collection Time    02/08/14  4:51 AM      Result Value Ref Range   Sodium 141  137 - 147 mEq/L   Potassium 4.1  3.7 - 5.3 mEq/L   Chloride 104  96 - 112 mEq/L   CO2 26  19 - 32 mEq/L   Glucose, Bld 127 (*) 70 - 99 mg/dL   BUN 9  6 - 23 mg/dL   Creatinine, Ser 0.70  0.50 - 1.10 mg/dL   Calcium 8.4  8.4 - 10.5 mg/dL   GFR calc non Af Amer 80 (*) >90 mL/min   GFR calc Af Amer >90  >90 mL/min   Comment: (NOTE)     The eGFR has been calculated using the CKD EPI equation.     This calculation has not been validated in all clinical situations.     eGFR's persistently <90 mL/min signify possible Chronic Kidney     Disease.  CBC     Status: Abnormal   Collection Time    02/09/14  4:14 AM      Result Value Ref Range   WBC 8.5  4.0 - 10.5 K/uL   RBC 3.86 (*) 3.87 - 5.11 MIL/uL   Hemoglobin 11.4 (*) 12.0 - 15.0 g/dL   HCT 34.3 (*) 36.0 - 46.0 %   MCV 88.9  78.0 - 100.0 fL   MCH 29.5  26.0 - 34.0 pg   MCHC 33.2  30.0 - 36.0 g/dL   RDW 13.3  11.5 - 15.5 %   Platelets 202  150 - 400 K/uL  BASIC METABOLIC PANEL     Status: Abnormal   Collection Time    02/09/14  4:14 AM      Result Value Ref Range   Sodium 140  137 - 147 mEq/L   Potassium 3.9  3.7 - 5.3 mEq/L   Chloride 103  96 - 112 mEq/L   CO2 25  19 - 32 mEq/L   Glucose, Bld 123 (*) 70 - 99 mg/dL   BUN 11  6 - 23 mg/dL   Creatinine, Ser 0.70  0.50 - 1.10 mg/dL   Calcium 8.4  8.4 - 10.5 mg/dL   GFR calc non Af Amer 80 (*) >90 mL/min   GFR calc Af Amer >90  >90 mL/min   Comment: (NOTE)     The eGFR has been calculated using the CKD EPI equation.     This calculation has not been validated in all clinical situations.     eGFR's persistently <90 mL/min signify possible Chronic Kidney     Disease.    Studies/Results: Dg Pelvis Portable  02/08/2014   CLINICAL DATA:  Left hip replacement  EXAM: PORTABLE PELVIS 1-2 VIEWS  COMPARISON:  02/06/2014  FINDINGS: Left hip hemiarthroplasty identified.  A prosthesis is located on the frontal views.  No complicating features are identified.  IMPRESSION: Left hip hemiarthroplasty without complicating features.   Electronically Signed   By: Hassan Rowan M.D.   On: 02/08/2014 01:35   Medications: Scheduled Meds: . aspirin EC  325 mg Oral BID  . docusate sodium  100 mg Oral BID  . ferrous sulfate  325 mg Oral TID PC  . polyethylene glycol  17 g Oral BID  .  senna  1 tablet Oral BID   Continuous Infusions: . dextrose 5 % and 0.45 % NaCl with KCl 20 mEq/L 100 mL/hr at 02/08/14 0453  . lactated ringers 50 mL/hr at 02/07/14 1614   PRN Meds:.bisacodyl, HYDROcodone-acetaminophen, LORazepam, LORazepam, menthol-cetylpyridinium, methocarbamol (ROBAXIN) IV, methocarbamol, metoCLOPramide (REGLAN) injection, metoCLOPramide, morphine injection, ondansetron (ZOFRAN) IV, ondansetron, phenol  Assessment/Plan:  Principal Problem:  Fracture of femoral neck, left - as per orthopedics. Patient trying to transition home with home health physical therapy tomorrow Active Problems:  Dementia arising in the senium and presenium - stable and tolerated surgery well  Fall due to ice or snow  GERD (gastroesophageal reflux disease) - medically stable  Postoperative tachycardia - resolved with beta blocker therapy Nocturnal anxiety - treated with lorazepam Disposition - possible transfer home with home health physical therapy tomorrow   LOS: 3 days   Henrine Screws, MD 02/09/2014, 7:58 AM

## 2014-02-09 NOTE — Care Management Note (Signed)
CARE MANAGEMENT NOTE 02/09/2014  Patient:  Winters,Betina   Account Number:  [id number]  Date Initiated:  02/07/2014  Documentation initiated by:  Vance PeperBRADY,Wataru Mccowen  Subjective/Objective Assessment:   80 yr old female admitted with Left hip fracture. Patient for surgery today.     Action/Plan:   CM will follow  Case manager spoke with patient and husband concerning home health needs and DME .Husband states he and family will assist patient, do not want SNF.  Choice offered, [facility]   Anticipated DC Date:  02/11/2014   Anticipated DC Plan:  HOME W HOME HEALTH SERVICES      DC Planning Services  CM consult      [facility]  DURABLE MEDICAL EQUIPMENT   Choice offered to / List presented to:  C-3 Spouse   DME arranged  3-N-1        DME agency  [facility]     HH arranged  HH-2 PT  HH-6 SOCIAL WORKER  HH-3 OT      [facility]   Status of service:  Completed, signed off Medicare Important Message given?   (If response is "NO", the following Medicare IM given date fields will be blank) Date Medicare IM given:   Date Additional Medicare IM given:     Discharge Disposition: Home with Home Health

## 2014-02-09 NOTE — Progress Notes (Signed)
Patient ID: Jamie HatcherHelen Barber, female   DOB: 05-18-1932, 80 y.o.   MRN: [id number]  This represents documentation from visit last night 2/24 Husband and one of her grandsons by bedside.  No events Reported activity walking down hallway  Dressing intact and clean NVI distally  Plan: Up with therapy Social work, case management to work with family and therapy to determine safest disposition - husband desperately wants her home

## 2014-02-10 LAB — BASIC METABOLIC PANEL
BUN: 11 mg/dL (ref 6–23)
CO2: 27 meq/L (ref 19–32)
Calcium: 8.7 mg/dL (ref 8.4–10.5)
Chloride: 102 mEq/L (ref 96–112)
Creatinine, Ser: 0.68 mg/dL (ref 0.50–1.10)
GFR calc Af Amer: >90 mL/min (ref >90)
GFR, EST NON AFRICAN AMERICAN: 80 mL/min — AB (ref >90)
Glucose, Bld: 105 mg/dL — ABNORMAL HIGH (ref 70–99)
POTASSIUM: 3.7 meq/L (ref 3.7–5.3)
SODIUM: 141 meq/L (ref 137–147)

## 2014-02-10 LAB — CBC
HCT: 33.2 % — ABNORMAL LOW (ref 36.0–46.0)
Hemoglobin: 11.2 g/dL — ABNORMAL LOW (ref 12.0–15.0)
MCH: 29.9 pg (ref 26.0–34.0)
MCHC: 33.7 g/dL (ref 30.0–36.0)
MCV: 88.5 fL (ref 78.0–100.0)
Platelets: 209 10*3/uL (ref 150–400)
RBC: 3.75 MIL/uL — AB (ref 3.87–5.11)
RDW: 13.2 % (ref 11.5–15.5)
WBC: 8.1 10*3/uL (ref 4.0–10.5)

## 2014-02-10 MED ORDER — METOPROLOL SUCCINATE ER 25 MG PO TB24
25.0000 mg | ORAL_TABLET | Freq: Every day | ORAL | Status: DC
  Administered 2014-02-10 – 2014-02-11 (×2): 25 mg via ORAL
  Filled 2014-02-10 (×2): qty 1

## 2014-02-10 NOTE — Progress Notes (Signed)
Physical Therapy Treatment Patient Details Name: Jamie HatcherHelen Steers MRN: [id number] DOB: 12/14/1932 Today's Date: 02/10/2014 Time: [id number] PT Time Calculation (min): 27 min  PT Assessment / Plan / Recommendation  History of Present Illness Pt is an 80 y/o female admitted s/p fall on ice, sustaining a femur fracture. Pt is now s/p L hip hemiarthroplasty and is PWB 50%.   PT Comments   Pt progressing towards physical therapy goals, however continues to have difficulty maintaining PWB status and states out right that she will put all her weight through that leg if she wants to. Discussed safety with pt and husband, and provided education regarding safe negotiation of stairs to enter home. Pt with difficulty following directions for safety. Will plan to see patient prior to d/c for continued stair training.   Follow Up Recommendations  Home health PT;Supervision/Assistance - 24 hour     Does the patient have the potential to tolerate intense rehabilitation     Barriers to Discharge        Equipment Recommendations  3in1 (PT)    Recommendations for Other Services    Frequency Min 5X/week   Progress towards PT Goals Progress towards PT goals: Progressing toward goals  Plan Current plan remains appropriate    Precautions / Restrictions Precautions Precautions: Fall;Posterior Hip Precaution Booklet Issued: Yes (comment) Precaution Comments: Pt unable to recall hip precautions. Reviewed 3/3 Restrictions Weight Bearing Restrictions: Yes LLE Weight Bearing: Partial weight bearing LLE Partial Weight Bearing Percentage or Pounds: 50   Pertinent Vitals/Pain Pt reports no pain throughout session.     Mobility  Bed Mobility General bed mobility comments: Pt received sitting in recliner Transfers Overall transfer level: Needs assistance Equipment used: Rolling walker (2 wheeled) Transfers: Sit to/from Stand Sit to Stand: Min guard General transfer comment: VC's for hand placement on seated  surface and to maintain hip precautions.  Ambulation/Gait Ambulation/Gait assistance: Min guard Ambulation Distance (Feet): 125 Feet Assistive device: Rolling walker (2 wheeled) Gait Pattern/deviations: Step-to pattern;Step-through pattern;Decreased stride length Gait velocity: Decreased Gait velocity interpretation: Below normal speed for age/gender General Gait Details: VC's for PWB status - pt reports she is going to put her full weight through that leg no matter what. Almost continuous cueing for PWB status, advising to only touch toe down to decrease WB on the operative side.  Stairs: Yes Stairs assistance: Min assist Stair Management: Backwards;With walker Number of Stairs: 2 (x2) General stair comments: Pt and family education on safety and sequencing while negotiating steps. Husband able to cue pt well, however pt impulsive and had difficulty following directions.     Exercises     PT Diagnosis:    PT Problem List:   PT Treatment Interventions:     PT Goals (current goals can now be found in the care plan section) Acute Rehab PT Goals Patient Stated Goal: go home PT Goal Formulation: With patient/family Time For Goal Achievement: 02/22/14 Potential to Achieve Goals: Good  Visit Information  Last PT Received On: 02/10/14 Assistance Needed: +1 History of Present Illness: Pt is an 80 y/o female admitted s/p fall on ice, sustaining a femur fracture. Pt is now s/p L hip hemiarthroplasty and is PWB 50%.    Subjective Data  Subjective: Pt agreeable to PT, however appears angry when discussing family's concern for safety at home. Patient Stated Goal: go home   Cognition  Cognition Arousal/Alertness: Awake/alert Behavior During Therapy: Impulsive Overall Cognitive Status: History of cognitive impairments - at baseline Memory: Decreased short-term memory;Decreased recall  of precautions    Balance  Balance Overall balance assessment: Needs assistance Sitting-balance  support: Feet supported;Bilateral upper extremity supported Sitting balance-Leahy Scale: Fair Standing balance support: Bilateral upper extremity supported Standing balance-Leahy Scale: Fair Standing balance comment: min guard assist General Comments General comments (skin integrity, edema, etc.): Husband states he has been taking pt to bathroom all day. Encouraged use of call bell for staff assist for safety.   End of Session PT - End of Session Equipment Utilized During Treatment: Gait belt Activity Tolerance: Patient tolerated treatment well Patient left: in chair;with call bell/phone within reach;with family/visitor present Nurse Communication: Mobility status   GP     Ruthann Cancer 02/10/2014, 5:23 PM  Ruthann Cancer, PT, DPT [phone]

## 2014-02-10 NOTE — Progress Notes (Signed)
Occupational Therapy Treatment Patient Details Name: Jamie HatcherHelen Barber MRN: [id number] DOB: 1932-06-20 Today's Date: 02/10/2014 Time: [id number] OT Time Calculation (min): 30 min  OT Assessment / Plan / Recommendation  History of present illness Pt is progressing well post op, but has decreased safety with RW   OT comments  Husband has been assisting pt with ADL and mobility to bathroom.  He is knowledgeable in hip precautions and fall prevention.  Pt continues to require very close supervision and verbal cues to maintain PWB status with mobility.  Follow Up Recommendations  Home health OT;Supervision/Assistance - 24 hour    Barriers to Discharge       Equipment Recommendations       Recommendations for Other Services    Frequency Min 2X/week   Progress towards OT Goals Progress towards OT goals: Progressing toward goals  Plan Discharge plan remains appropriate    Precautions / Restrictions Precautions Precautions: Fall;Posterior Hip Precaution Booklet Issued: Yes (comment) Precaution Comments: Reviewed precautions with pt and spouse present Restrictions Weight Bearing Restrictions: Yes LLE Weight Bearing: Partial weight bearing LLE Partial Weight Bearing Percentage or Pounds: 50   Pertinent Vitals/Pain VSS, no pain    ADL  Grooming: Wash/dry hands;Min guard Where Assessed - Grooming: Unsupported standing Toilet Transfer: Min Pension scheme managerguard Toilet Transfer Method: Sit to [place] Toilet Transfer Equipment: Raised toilet seat with arms (or 3-in-1 over toilet) Toileting - Clothing Manipulation and Hygiene: Supervision/safety Where Assessed - Glass blower/designerToileting Clothing Manipulation and Hygiene: Sit on 3-in-1 or toilet Equipment Used: Gait belt;Rolling walker Transfers/Ambulation Related to ADLs: supervision with RW ADL Comments: Pt's husband will assist with LB ADL.  No interest in AE.  Pt's husband is well aware of hip precautions.  Husband has been taking pt to bathroom and sink at the hospital  and feels comfortable with this.  Plans to sponge bathe as pt has glass shower doors.    OT Diagnosis:    OT Problem List:   OT Treatment Interventions:     OT Goals(current goals can now be found in the care plan section) Acute Rehab OT Goals Patient Stated Goal: go home  Visit Information  Last OT Received On: 02/10/14 Assistance Needed: +1 History of Present Illness: Pt is progressing well post op, but has decreased safety with RW    Subjective Data      Prior Functioning       Cognition  Cognition Arousal/Alertness: Awake/alert Behavior During Therapy: Impulsive Overall Cognitive Status: History of cognitive impairments - at baseline Memory: Decreased short-term memory;Decreased recall of precautions    Mobility  Transfers Overall transfer level: Needs assistance Transfers: Sit to/from Stand Sit to Stand: Min guard General transfer comment: pt with difficulty maintaining PWB status and sequencing walker use    Exercises      Balance Balance Overall balance assessment: Needs assistance Standing balance comment: min guard assist  End of Session OT - End of Session Activity Tolerance: Patient tolerated treatment well Patient left: in chair;with call bell/phone within reach;with family/visitor present Nurse Communication: Mobility status  GO     [name], [name] 02/10/2014, 3:24 PM [phone]

## 2014-02-10 NOTE — Progress Notes (Signed)
Subjective: [name] is slowly improving and prepaired for discharge home in the a.m. for home physical therapy  instead of transfer to a skilled facility for physical therapy. Husband is adamant about not going to the skilled facility to some other family members want her to go. [name] prefers to go home as well. She is progressing well here at [facility] and with assistance from her husband, and likely transition to home with home physical therapy  Objective: Weight change:   Intake/Output Summary (Last 24 hours) at 02/10/14 0944 Last data filed at 02/10/14 0900  Gross per 24 hour  Intake    100 ml  Output      2 ml  Net     98 ml   Filed Vitals:   02/09/14 1300 02/09/14 1600 02/09/14 2112 02/10/14 0555  BP: 142/62  147/73 119/78  Pulse: 120  99 85  Temp: 98 F (36.7 C)  98.8 F (37.1 C) 98 F (36.7 C)  TempSrc: Oral  Oral Oral  Resp: 16 18 18 18   Height:      Weight:      SpO2: 98% 98% 98% 98%    General Appearance: Alert, cooperative, no distress, appears stated age  Lungs: Clear to auscultation bilaterally, respirations unlabored  Heart: Regular rate and rhythm, S1 and S2 normal, no murmur, rub or gallop  Abdomen: Soft, non-tender, bowel sounds active all four quadrants, no masses, no organomegaly  Extremities: Both lower extremities without significant edema. Wound dressing over left that is dry with no surrounding erythema or significant bruising  Neuro: Oriented to name and place, nonfocal  Lab Results: Results for orders placed during the hospital encounter of 02/06/14 (from the past 48 hour(s))  CBC     Status: Abnormal   Collection Time    02/09/14  4:14 AM      Result Value Ref Range   WBC 8.5  4.0 - 10.5 K/uL   RBC 3.86 (*) 3.87 - 5.11 MIL/uL   Hemoglobin 11.4 (*) 12.0 - 15.0 g/dL   HCT 34.3 (*) 36.0 - 46.0 %   MCV 88.9  78.0 - 100.0 fL   MCH 29.5  26.0 - 34.0 pg   MCHC 33.2  30.0 - 36.0 g/dL   RDW 13.3  11.5 - 15.5 %   Platelets 202  150 - 400 K/uL  BASIC  METABOLIC PANEL     Status: Abnormal   Collection Time    02/09/14  4:14 AM      Result Value Ref Range   Sodium 140  137 - 147 mEq/L   Potassium 3.9  3.7 - 5.3 mEq/L   Chloride 103  96 - 112 mEq/L   CO2 25  19 - 32 mEq/L   Glucose, Bld 123 (*) 70 - 99 mg/dL   BUN 11  6 - 23 mg/dL   Creatinine, Ser 0.70  0.50 - 1.10 mg/dL   Calcium 8.4  8.4 - 10.5 mg/dL   GFR calc non Af Amer 80 (*) >90 mL/min   GFR calc Af Amer >90  >90 mL/min   Comment: (NOTE)     The eGFR has been calculated using the CKD EPI equation.     This calculation has not been validated in all clinical situations.     eGFR's persistently <90 mL/min signify possible Chronic Kidney     Disease.  CBC     Status: Abnormal   Collection Time    02/10/14  5:03 AM  Result Value Ref Range   WBC 8.1  4.0 - 10.5 K/uL   RBC 3.75 (*) 3.87 - 5.11 MIL/uL   Hemoglobin 11.2 (*) 12.0 - 15.0 g/dL   HCT 33.2 (*) 36.0 - 46.0 %   MCV 88.5  78.0 - 100.0 fL   MCH 29.9  26.0 - 34.0 pg   MCHC 33.7  30.0 - 36.0 g/dL   RDW 13.2  11.5 - 15.5 %   Platelets 209  150 - 400 K/uL  BASIC METABOLIC PANEL     Status: Abnormal   Collection Time    02/10/14  5:03 AM      Result Value Ref Range   Sodium 141  137 - 147 mEq/L   Potassium 3.7  3.7 - 5.3 mEq/L   Chloride 102  96 - 112 mEq/L   CO2 27  19 - 32 mEq/L   Glucose, Bld 105 (*) 70 - 99 mg/dL   BUN 11  6 - 23 mg/dL   Creatinine, Ser 0.68  0.50 - 1.10 mg/dL   Calcium 8.7  8.4 - 10.5 mg/dL   GFR calc non Af Amer 80 (*) >90 mL/min   GFR calc Af Amer >90  >90 mL/min   Comment: (NOTE)     The eGFR has been calculated using the CKD EPI equation.     This calculation has not been validated in all clinical situations.     eGFR's persistently <90 mL/min signify possible Chronic Kidney     Disease.    Studies/Results: No results found. Medications: Scheduled Meds: . aspirin EC  325 mg Oral BID  . docusate sodium  100 mg Oral BID  . ferrous sulfate  325 mg Oral TID PC  . polyethylene  glycol  17 g Oral BID  . senna  1 tablet Oral BID   Continuous Infusions:  PRN Meds:.bisacodyl, HYDROcodone-acetaminophen, LORazepam, LORazepam, menthol-cetylpyridinium, methocarbamol (ROBAXIN) IV, methocarbamol, morphine injection, ondansetron, phenol  Assessment/Plan:  Principal Problem:  Fracture of femoral neck, left - as per orthopedics. Patient trying to transition home with home health physical therapy tomorrow - Friday  Active Problems:  Dementia arising in the senium and presenium - stable and tolerated surgery well  Fall due to ice or snow  GERD (gastroesophageal reflux disease) - medically stable  Postoperative tachycardia - resolved with beta blocker therapy - will continue low-dose orally Nocturnal anxiety - treate with lorazepam  Disposition - probable transfer home with home health physical therapy tomorrow - Friday - transportation provided by family    LOS: 4 days   Henrine Screws, MD 02/10/2014, 9:44 AM

## 2014-02-11 LAB — URINALYSIS, ROUTINE W REFLEX MICROSCOPIC
Bilirubin Urine: NEGATIVE
GLUCOSE, UA: NEGATIVE mg/dL
Hgb urine dipstick: NEGATIVE
Ketones, ur: NEGATIVE mg/dL
LEUKOCYTES UA: NEGATIVE
NITRITE: NEGATIVE
PROTEIN: NEGATIVE mg/dL
Specific Gravity, Urine: 1.013 (ref 1.005–1.030)
Urobilinogen, UA: 0.2 mg/dL (ref 0.0–1.0)
pH: 6 (ref 5.0–8.0)

## 2014-02-11 MED ORDER — METHOCARBAMOL 500 MG PO TABS: 500.0000 mg | ORAL_TABLET | Freq: Four times a day (QID) | ORAL | Status: DC | PRN

## 2014-02-11 MED ORDER — DSS 100 MG PO CAPS: 100.0000 mg | ORAL_CAPSULE | Freq: Two times a day (BID) | ORAL | Status: DC

## 2014-02-11 MED ORDER — ASPIRIN 325 MG PO TBEC: 325.0000 mg | DELAYED_RELEASE_TABLET | Freq: Two times a day (BID) | ORAL | Status: DC

## 2014-02-11 NOTE — Discharge Summary (Signed)
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 Patient is 50% weight-bearing on the left lower extremity at this time.  Active Problems:  Dementia arising in the senium and presenium - stable and tolerated surgery well  Fall due to ice or snow  GERD (gastroesophageal reflux disease) - medically stable  Postoperative tachycardia - resolved with beta blocker therapy - will continue low-dose orally - controlled  Nocturnal anxiety - treate with lorazepam  Disposition - Home with home health physical therapy today - transportation provided by family   Discharge Physical Exam:  General Appearance: Alert, cooperative, no distress, appears stated age  Weight change:   Intake/Output Summary (Last 24 hours) at 02/11/14 0807 Last data filed at 02/11/14 0606  Gross per 24 hour  Intake    360 ml  Output      1 ml  Net    359 ml   Filed Vitals:   02/10/14 2020 02/10/14 2341 02/11/14 0400 02/11/14 0533  BP: 130/88   136/56  Pulse: 114   99  Temp: 99.3 F (37.4 C)   98 F (36.7 C)  TempSrc: Oral   Oral  Resp: 16 17 14 16   Height:      Weight:      SpO2: 96%   97%    General Appearance: Alert, cooperative, no distress, appears stated age  Lungs: Clear to auscultation bilaterally, respirations unlabored  Heart: Regular rate and rhythm, S1 and S2 normal, no murmur, rub or gallop  Abdomen: Soft, non-tender, bowel sounds active all four quadrants, no masses, no organomegaly  Extremities: Both lower extremities without significant edema. Wound dressing over left that is dry with no  surrounding erythema or significant bruising  Neuro: Oriented to name and place, nonfocal  Discharge Condition: Improved  Hospital Course: [name] is a very pleasant 80 year old female with mild to moderate dementia and GERD who slipped and fell on the ice on the day of admission and was found to have a left femoral neck fracture. This was repaired by Dr. Vernie Ammons. Her postoperative course has been very good and family and patient desired to go home with home health physical therapy. She is now 50% weight-bearing on the left lower extremity. Ready to transition home with 24-hour assistance  Things to follow up in the outpatient setting: No more than 50% weight-bearing on the left lower extremity with walker use. 24-hour assistance necessary and family is able and willing to provide  Consults: Treatment Team:  Shelda Pal, MD Marden Noble, MD  Disposition:   Discharge Orders   Future Appointments Provider Department Dept Phone   10/12/2014 12:00 PM Melvyn Novas, MD [facility] [phone]   Future Orders Complete By Expires   Call MD / Call 911  As directed    Comments:     If you experience chest pain or shortness of breath, CALL 911 and be transported to the hospital emergency room.  If you develope a fever above 101 F, pus (white drainage) or increased drainage or redness at the wound, or calf pain, call your surgeon's office.   Call  MD for:  difficulty breathing, headache or visual disturbances  As directed    Call MD for:  severe uncontrolled pain  As directed    Call MD for:  temperature >100.4  As directed    Constipation Prevention  As directed    Comments:     Drink plenty of fluids.  Prune juice may be helpful.  You may use a stool softener, such as Colace (over the counter) 100 mg twice a day.  Use MiraLax (over the counter) for constipation as needed.   Diet - low sodium heart healthy  As directed    Diet - low sodium heart healthy  As  directed    Discharge instructions  As directed    Comments:     Maintain surgical dressing for 14 days, or until follow up in the clinic. Follow up in 2 weeks at [facility]. Call with any questions or concerns.   Discharge instructions  As directed    Comments:     Ambulate only with assistance. Call M.D. if fever or chills or shortness of breath occur or if increased pain occurs in the area of surgery   Increase activity slowly  As directed    Partial weight bearing  As directed    Questions:     % Body Weight:  50   Laterality:  left   Extremity:  Lower   TED hose  As directed    Comments:     Use stockings (TED hose) for 2 weeks on both leg(s).  You may remove them at night for sleeping.       Medication List         aspirin EC 325 MG tablet  Take 1 tablet (325 mg total) by mouth 2 (two) times daily. Take for 4 weeks.     aspirin 325 MG EC tablet  Take 1 tablet (325 mg total) by mouth 2 (two) times daily.     donepezil 10 MG tablet  Commonly known as:  ARICEPT  Take 10 mg by mouth every morning.     DSS 100 MG Caps  Take 100 mg by mouth 2 (two) times daily.     HYDROcodone-acetaminophen 5-325 MG per tablet  Commonly known as:  NORCO/VICODIN  Take 1-2 tablets by mouth every 6 (six) hours as needed for moderate pain.     methocarbamol 500 MG tablet  Commonly known as:  ROBAXIN  Take 1 tablet (500 mg total) by mouth every 6 (six) hours as needed for muscle spasms.     pantoprazole 40 MG tablet  Commonly known as:  PROTONIX  Take 40 mg by mouth daily.     VITAMIN B 12 PO  Take 1 tablet by mouth daily.     Vitamin D (Cholecalciferol) 1000 UNITS Tabs  Take 2,000 Units by mouth daily.           Follow-up Information   Follow up with Shelda Pal, MD. Schedule an appointment as soon as possible for a visit in 2 weeks.   Specialty:  Orthopedic Surgery   Contact information:   [address] [place] [address] [phone]        The results of significant diagnostics from this hospitalization (including imaging, microbiology, ancillary and laboratory) are listed below for reference.    Significant Diagnostic Studies: Dg Chest 1 View  02/06/2014   CLINICAL DATA:  Fall, left hip fracture  EXAM: CHEST - 1 VIEW  COMPARISON:  06/19/2004  FINDINGS:  Mild cardiomegaly. Negative for CHF or pneumonia. Minor basilar atelectasis. No effusion or pneumothorax. Trachea midline. Atherosclerosis of the aorta.  IMPRESSION: Cardiomegaly without acute process   Electronically Signed   By: Ruel Favorsrevor  Shick M.D.   On: 02/06/2014 15:33   Dg Hip Complete Left  02/06/2014   CLINICAL DATA:  Fall.  Left hip pain.  EXAM: LEFT HIP - COMPLETE 2+ VIEW  COMPARISON:  None.  FINDINGS: Acute, impacted left femoral neck fracture is seen. No evidence of dislocation. No pelvic fracture identified.  IMPRESSION: Acute left femoral neck fracture.   Electronically Signed   By: Myles RosenthalJohn  Stahl M.D.   On: 02/06/2014 15:31   Dg Pelvis Portable  02/08/2014   CLINICAL DATA:  Left hip replacement  EXAM: PORTABLE PELVIS 1-2 VIEWS  COMPARISON:  02/06/2014  FINDINGS: Left hip hemiarthroplasty identified.  A prosthesis is located on the frontal views.  No complicating features are identified.  IMPRESSION: Left hip hemiarthroplasty without complicating features.   Electronically Signed   By: Laveda AbbeJeff  Hu M.D.   On: 02/08/2014 01:35    Microbiology: Recent Results (from the past 240 hour(s))  URINE CULTURE     Status: None   Collection Time    02/06/14  8:18 PM      Result Value Ref Range Status   Specimen Description URINE, CATHETERIZED   Final   Special Requests NONE   Final   Culture  Setup Time     Final   Value: 02/07/2014 02:31     Performed at [facility]   Colony Count     Final   Value: NO GROWTH     Performed at [facility]   Culture     Final   Value: NO GROWTH     Performed at [facility]   Report Status 02/07/2014 FINAL    Final  SURGICAL PCR SCREEN     Status: None   Collection Time    02/07/14 12:22 AM      Result Value Ref Range Status   MRSA, PCR NEGATIVE  NEGATIVE Final   Staphylococcus aureus NEGATIVE  NEGATIVE Final   Comment:            The Xpert SA Assay (FDA     approved for NASAL specimens     in patients over 721 years of age),     is one component of     a comprehensive surveillance     program.  Test performance has     been validated by [facility] for patients greater     than or equal to 78 year old.     It is not intended     to diagnose infection nor to     guide or monitor treatment.     Labs: Results for orders placed during the hospital encounter of 02/06/14  URINE CULTURE      Result Value Ref Range   Specimen Description URINE, CATHETERIZED     Special Requests NONE     Culture  Setup Time       Value: 02/07/2014 02:31     Performed at [facility]   Colony Count       Value: NO GROWTH     Performed at [facility]   Culture       Value: NO GROWTH     Performed at [facility]   Report Status 02/07/2014 FINAL    SURGICAL  PCR SCREEN      Result Value Ref Range   MRSA, PCR NEGATIVE  NEGATIVE   Staphylococcus aureus NEGATIVE  NEGATIVE  BASIC METABOLIC PANEL      Result Value Ref Range   Sodium 143  137 - 147 mEq/L   Potassium 3.5 (*) 3.7 - 5.3 mEq/L   Chloride 105  96 - 112 mEq/L   CO2 23  19 - 32 mEq/L   Glucose, Bld 112 (*) 70 - 99 mg/dL   BUN 16  6 - 23 mg/dL   Creatinine, Ser 8.11  0.50 - 1.10 mg/dL   Calcium 9.2  8.4 - 91.4 mg/dL   GFR calc non Af Amer 82 (*) >90 mL/min   GFR calc Af Amer >90  >90 mL/min  CBC WITH DIFFERENTIAL      Result Value Ref Range   WBC 6.0  4.0 - 10.5 K/uL   RBC 4.86  3.87 - 5.11 MIL/uL   Hemoglobin 14.5  12.0 - 15.0 g/dL   HCT 78.2  95.6 - 21.3 %   MCV 88.1  78.0 - 100.0 fL   MCH 29.8  26.0 - 34.0 pg   MCHC 33.9  30.0 - 36.0 g/dL   RDW 08.6  57.8 - 46.9 %   Platelets 255  150 - 400 K/uL    Neutrophils Relative % 58  43 - 77 %   Neutro Abs 3.5  1.7 - 7.7 K/uL   Lymphocytes Relative 31  12 - 46 %   Lymphs Abs 1.9  0.7 - 4.0 K/uL   Monocytes Relative 9  3 - 12 %   Monocytes Absolute 0.5  0.1 - 1.0 K/uL   Eosinophils Relative 2  0 - 5 %   Eosinophils Absolute 0.1  0.0 - 0.7 K/uL   Basophils Relative 1  0 - 1 %   Basophils Absolute 0.0  0.0 - 0.1 K/uL  PROTIME-INR      Result Value Ref Range   Prothrombin Time 12.6  11.6 - 15.2 seconds   INR 0.96  0.00 - 1.49  URINALYSIS, ROUTINE W REFLEX MICROSCOPIC      Result Value Ref Range   Color, Urine YELLOW  YELLOW   APPearance CLEAR  CLEAR   Specific Gravity, Urine 1.028  1.005 - 1.030   pH 5.5  5.0 - 8.0   Glucose, UA 100 (*) NEGATIVE mg/dL   Hgb urine dipstick NEGATIVE  NEGATIVE   Bilirubin Urine SMALL (*) NEGATIVE   Ketones, ur 15 (*) NEGATIVE mg/dL   Protein, ur NEGATIVE  NEGATIVE mg/dL   Urobilinogen, UA 0.2  0.0 - 1.0 mg/dL   Nitrite NEGATIVE  NEGATIVE   Leukocytes, UA NEGATIVE  NEGATIVE  CBC      Result Value Ref Range   WBC 8.8  4.0 - 10.5 K/uL   RBC 4.27  3.87 - 5.11 MIL/uL   Hemoglobin 12.8  12.0 - 15.0 g/dL   HCT 62.9  52.8 - 41.3 %   MCV 89.0  78.0 - 100.0 fL   MCH 30.0  26.0 - 34.0 pg   MCHC 33.7  30.0 - 36.0 g/dL   RDW 24.4  01.0 - 27.2 %   Platelets 222  150 - 400 K/uL  BASIC METABOLIC PANEL      Result Value Ref Range   Sodium 141  137 - 147 mEq/L   Potassium 4.1  3.7 - 5.3 mEq/L   Chloride 104  96 - 112 mEq/L  CO2 26  19 - 32 mEq/L   Glucose, Bld 127 (*) 70 - 99 mg/dL   BUN 9  6 - 23 mg/dL   Creatinine, Ser 1.61  0.50 - 1.10 mg/dL   Calcium 8.4  8.4 - 09.6 mg/dL   GFR calc non Af Amer 80 (*) >90 mL/min   GFR calc Af Amer >90  >90 mL/min  CBC      Result Value Ref Range   WBC 8.5  4.0 - 10.5 K/uL   RBC 3.86 (*) 3.87 - 5.11 MIL/uL   Hemoglobin 11.4 (*) 12.0 - 15.0 g/dL   HCT 04.5 (*) 40.9 - 81.1 %   MCV 88.9  78.0 - 100.0 fL   MCH 29.5  26.0 - 34.0 pg   MCHC 33.2  30.0 - 36.0 g/dL   RDW  91.4  78.2 - 95.6 %   Platelets 202  150 - 400 K/uL  BASIC METABOLIC PANEL      Result Value Ref Range   Sodium 140  137 - 147 mEq/L   Potassium 3.9  3.7 - 5.3 mEq/L   Chloride 103  96 - 112 mEq/L   CO2 25  19 - 32 mEq/L   Glucose, Bld 123 (*) 70 - 99 mg/dL   BUN 11  6 - 23 mg/dL   Creatinine, Ser 2.13  0.50 - 1.10 mg/dL   Calcium 8.4  8.4 - 08.6 mg/dL   GFR calc non Af Amer 80 (*) >90 mL/min   GFR calc Af Amer >90  >90 mL/min  CBC      Result Value Ref Range   WBC 8.1  4.0 - 10.5 K/uL   RBC 3.75 (*) 3.87 - 5.11 MIL/uL   Hemoglobin 11.2 (*) 12.0 - 15.0 g/dL   HCT 57.8 (*) 46.9 - 62.9 %   MCV 88.5  78.0 - 100.0 fL   MCH 29.9  26.0 - 34.0 pg   MCHC 33.7  30.0 - 36.0 g/dL   RDW 52.8  41.3 - 24.4 %   Platelets 209  150 - 400 K/uL  BASIC METABOLIC PANEL      Result Value Ref Range   Sodium 141  137 - 147 mEq/L   Potassium 3.7  3.7 - 5.3 mEq/L   Chloride 102  96 - 112 mEq/L   CO2 27  19 - 32 mEq/L   Glucose, Bld 105 (*) 70 - 99 mg/dL   BUN 11  6 - 23 mg/dL   Creatinine, Ser 0.10  0.50 - 1.10 mg/dL   Calcium 8.7  8.4 - 27.2 mg/dL   GFR calc non Af Amer 80 (*) >90 mL/min   GFR calc Af Amer >90  >90 mL/min  TYPE AND SCREEN      Result Value Ref Range   ABO/RH(D) O POS     Antibody Screen NEG     Sample Expiration 02/09/2014    ABO/RH      Result Value Ref Range   ABO/RH(D) O POS      Time coordinating discharge: 34 minutes  Signed: [name], MD 02/11/2014, 8:07 AM

## 2014-02-11 NOTE — Progress Notes (Signed)
Patient's husband was very concerned that his wife may have a UTI. No symptoms were present; however, he stated she gets them frequently, and they contribute to her confusion. A U/A was collected and sent to lab; results came back negative for UTI. Patient was discharged home from the hospital with her family.

## 2014-02-11 NOTE — Progress Notes (Signed)
Physical Therapy Treatment Patient Details Name: Jamie HatcherHelen Barber MRN: [id number] DOB: [birth year] Today's Date: 02/11/2014 Time: [id number] PT Time Calculation (min): 38 min  PT Assessment / Plan / Recommendation  History of Present Illness Pt is an 80 y/o female admitted s/p fall on ice, sustaining a femur fracture. Pt is now s/p L hip hemiarthroplasty and is PWB 50%.   PT Comments   Pt progressing towards physical therapy goals. Pt continues to frequently perform FWB during OOB activity, however husband is improving in cueing her to maintain PWB status. Pt/husband have no concerns with returning home, and safety was emphasized throughout session. They anticipate d/c home this afternoon.   Follow Up Recommendations  Home health PT;Supervision/Assistance - 24 hour     Does the patient have the potential to tolerate intense rehabilitation     Barriers to Discharge        Equipment Recommendations  3in1 (PT)    Recommendations for Other Services    Frequency Min 5X/week   Progress towards PT Goals Progress towards PT goals: Progressing toward goals  Plan Current plan remains appropriate    Precautions / Restrictions Precautions Precautions: Fall;Posterior Hip Precaution Comments: Pt unable to recall hip precautions. Reviewed 3/3 Restrictions Weight Bearing Restrictions: Yes LLE Weight Bearing: Partial weight bearing LLE Partial Weight Bearing Percentage or Pounds: 50   Pertinent Vitals/Pain Pt does not report any pain throughout session.     Mobility  Bed Mobility General bed mobility comments: Pt received sitting in recliner Transfers Overall transfer level: Needs assistance Equipment used: Rolling walker (2 wheeled) Transfers: Sit to/from Stand Sit to Stand: Min guard General transfer comment: VC's for hand placement on seated surface for safety.  Ambulation/Gait Ambulation/Gait assistance: Min guard Ambulation Distance (Feet): 200 Feet Assistive device: Rolling walker (2  wheeled) Gait Pattern/deviations: Step-to pattern;Step-through pattern;Decreased stride length Gait velocity: Decreased Gait velocity interpretation: Below normal speed for age/gender General Gait Details: VC's for PWB status. Continued cueing for TTWB to encourage decreased WB on L, as pt consistently FWB if not cued.  Stairs: Yes Stairs assistance: Min guard Stair Management: Backwards;With walker Number of Stairs: 2 (x2) General stair comments: Husband led stair training with PT present for safety/guarding. He was able to cue her well for sequencing and safety, encouraging PWB status.     Exercises     PT Diagnosis:    PT Problem List:   PT Treatment Interventions:     PT Goals (current goals can now be found in the care plan section) Acute Rehab PT Goals Patient Stated Goal: go home PT Goal Formulation: With patient/family Time For Goal Achievement: 02/22/14 Potential to Achieve Goals: Good  Visit Information  Last PT Received On: 02/11/14 Assistance Needed: +1 History of Present Illness: Pt is an 80 y/o female admitted s/p fall on ice, sustaining a femur fracture. Pt is now s/p L hip hemiarthroplasty and is PWB 50%.    Subjective Data  Subjective: Pt agreeable to PT Patient Stated Goal: go home   Cognition  Cognition Arousal/Alertness: Awake/alert Behavior During Therapy: Impulsive Overall Cognitive Status: History of cognitive impairments - at baseline Memory: Decreased short-term memory;Decreased recall of precautions    Balance  Balance Overall balance assessment: Needs assistance Sitting-balance support: Feet supported;Bilateral upper extremity supported Sitting balance-Leahy Scale: Good Standing balance support: Bilateral upper extremity supported Standing balance-Leahy Scale: Fair General Comments General comments (skin integrity, edema, etc.): Provided handout for stair training and sequencing  End of Session PT - End of Session Equipment Utilized  During  Treatment: Gait belt Activity Tolerance: Patient tolerated treatment well Patient left: in chair;with call bell/phone within reach;with family/visitor present Nurse Communication: Mobility status   GP     Ruthann Cancer 02/11/2014, 12:10 PM  Ruthann Cancer, PT, DPT [phone]

## 2014-02-21 ENCOUNTER — Encounter (HOSPITAL_COMMUNITY): Payer: Self-pay | Admitting: Orthopedic Surgery

## 2014-02-21 NOTE — OR Nursing (Signed)
Late Entry on 02-21-2014 by D. Shyenne Maggard, RN to add Surgical procedure End time. 

## 2014-10-12 ENCOUNTER — Ambulatory Visit: Payer: Medicare Other | Admitting: Neurology

## 2014-10-12 ENCOUNTER — Encounter: Payer: Self-pay | Admitting: Adult Health

## 2014-10-12 ENCOUNTER — Ambulatory Visit (INDEPENDENT_AMBULATORY_CARE_PROVIDER_SITE_OTHER): Payer: Medicare Other | Admitting: Adult Health

## 2014-10-12 VITALS — BP 163/87 | Resp 92 | Ht 67.0 in | Wt 177.0 lb

## 2014-10-12 DIAGNOSIS — F068 Other specified mental disorders due to known physiological condition: Secondary | ICD-10-CM

## 2014-10-12 DIAGNOSIS — R251 Tremor, unspecified: Secondary | ICD-10-CM

## 2014-10-12 DIAGNOSIS — F0390 Unspecified dementia without behavioral disturbance: Secondary | ICD-10-CM

## 2014-10-12 MED ORDER — DONEPEZIL HCL 5 MG PO TABS: 5.0000 mg | ORAL_TABLET | Freq: Every day | ORAL | Status: DC

## 2014-10-12 NOTE — Progress Notes (Signed)
PATIENT: Jamie HatcherHelen Barber DOB: [birth year]  REASON FOR VISIT: follow up HISTORY FROM: patient  HISTORY OF PRESENT ILLNESS: Ms. [name] is an 80 year old female with a history of memory loss. She returns today for follow-up. She is currently taking  namenda. According to prior notes she was taking Aricept and namenda. The husband has no recollection of this. She reports that her memory is better. Husband states that he believes that her memory has gotten worse. She is able to complete all ADLs independently. Husband does help her get in and out of the bathtub due to a recent hip surgery.  She denies having to give up anything due to her memory.  Husband, daughter and grandson cook for the patient. Husband does most of the cleaning in the house. Patient and her husband do the laundry. She broke her hip in February and uses a cane to ambulate.  She has not driven since she broke her hip. She continues to go to church every Sunday. Husband notes that she has a tremor in the chin. She also has a tremor in the right hand that has affected her hand writing and her ability to eat. Husband notices the tremor mostly in the chin. She sleeps well at night. Denies acting out her dreams.   REVIEW OF SYSTEMS: Full 14 system review of systems performed and notable only for:  Constitutional: N/A  Eyes: N/A Ear/Nose/Throat: N/A  Skin: N/A  Cardiovascular: N/A  Respiratory: N/A  Gastrointestinal: N/A  Genitourinary: N/A Hematology/Lymphatic: N/A  Endocrine: N/A Musculoskeletal:joint pain   Allergy/Immunology: N/A  Neurological: memory loss Psychiatric: N/A Sleep: N/A   ALLERGIES: No Known Allergies  HOME MEDICATIONS: Outpatient Prescriptions Prior to Visit  Medication Sig Dispense Refill  . aspirin EC 325 MG EC tablet Take 1 tablet (325 mg total) by mouth 2 (two) times daily.  30 tablet  0  . Cyanocobalamin (VITAMIN B 12 PO) Take 1 tablet by mouth daily.       Marland Kitchen. donepezil (ARICEPT) 10 MG tablet Take  10 mg by mouth every morning.      . pantoprazole (PROTONIX) 40 MG tablet Take 40 mg by mouth daily.       . Vitamin D, Cholecalciferol, 1000 UNITS TABS Take 2,000 Units by mouth daily.      Marland Kitchen. docusate sodium 100 MG CAPS Take 100 mg by mouth 2 (two) times daily.  10 capsule  0  . HYDROcodone-acetaminophen (NORCO/VICODIN) 5-325 MG per tablet Take 1-2 tablets by mouth every 6 (six) hours as needed for moderate pain.  80 tablet  0  . methocarbamol (ROBAXIN) 500 MG tablet Take 1 tablet (500 mg total) by mouth every 6 (six) hours as needed for muscle spasms.  20 tablet  0   No facility-administered medications prior to visit.    PAST MEDICAL HISTORY: Past Medical History  Diagnosis Date  . Anxiety   . GERD (gastroesophageal reflux disease)   . Insomnia   . Memory loss   . Lumbago   . dementia   . Stroke 1985    no deficits    PAST SURGICAL HISTORY: Past Surgical History  Procedure Laterality Date  . Tonsillectomy    . Adenoidectomy    . Cholecystectomy    . Total vaginal hysterectomy    . Appendectomy    . Abdominal hysterectomy    . Hip arthroplasty Left 02/07/2014    Procedure: ARTHROPLASTY BIPOLAR HIP;  Surgeon: Shelda PalMatthew D Olin, MD;  Location: [facility] OR;  Service: Orthopedics;  Laterality: Left;    FAMILY HISTORY: Family History  Problem Relation Age of Onset  . Cancer Mother 2681  . Cancer Father [phone]  . Cancer Father     SOCIAL HISTORY: History   Social History  . Marital Status: Married    Spouse Name: N/A    Number of Children: 2  . Years of Education: 12   Occupational History  . Retired     Social History Main Topics  . Smoking status: Never Smoker   . Smokeless tobacco: Never Used  . Alcohol Use: No  . Drug Use: No  . Sexual Activity: Not on file   Other Topics Concern  . Not on file   Social History Narrative   Patient lives at home with husband    Patient has 2 children.    Patient has a high school education.    Patient is left handed.        PHYSICAL EXAM  Filed Vitals:   10/12/14 1026  BP: 163/87  Resp: 92  Height: 5\' 7"  (1.702 m)  Weight: 177 lb (80.287 kg)   Body mass index is 27.72 kg/(m^2).  Generalized: Well developed, in no acute distress   Neurological examination  Mentation: Alert.  Follows all commands speech and language fluent. MMSE 19/30 Cranial nerve II-XII: Pupils were equal round reactive to light. Extraocular movements were full, visual field were full on confrontational test. Facial sensation and strength were normal. Uvula tongue midline. Head turning and shoulder shrug  were normal and symmetric. Motor: The motor testing reveals 5 over 5 strength of all 4 extremities. Good symmetric motor tone is noted throughout. Mild tremor noted in the chin, intermittent tremor noted mainly in the left thumb. Sensory: Sensory testing is intact to soft touch on all 4 extremities. No evidence of extinction is noted.  Coordination: Cerebellar testing reveals good finger-nose-finger and heel-to-shin bilaterally.  Gait and station: Gait is normal. Romberg is negative. No drift is seen.  Reflexes: Deep tendon reflexes are symmetric and normal bilaterally.    DIAGNOSTIC DATA (LABS, IMAGING, TESTING) - I reviewed patient records, labs, notes, testing and imaging myself where available.  Lab Results  Component Value Date   WBC 8.1 02/10/2014   HGB 11.2* 02/10/2014   HCT 33.2* 02/10/2014   MCV 88.5 02/10/2014   PLT 209 02/10/2014      Component Value Date/Time   NA 141 02/10/2014 0503   K 3.7 02/10/2014 0503   CL 102 02/10/2014 0503   CO2 27 02/10/2014 0503   GLUCOSE 105* 02/10/2014 0503   BUN 11 02/10/2014 0503   CREATININE 0.68 02/10/2014 0503   CALCIUM 8.7 02/10/2014 0503   GFRNONAA 80* 02/10/2014 0503   GFRAA >90 02/10/2014 0503       ASSESSMENT AND PLAN 80 y.o. year old female  has a past medical history of Anxiety; GERD (gastroesophageal reflux disease); Insomnia; Memory loss; Lumbago; dementia; and  Stroke (1985). here with:  1. Memory loss 2. Tremor  Patient will continue taking Namenda.  I will prescribe Aricept 5 mg at bedtime. Will taper up to 10 mg if patient tolerates the medication. Tremor is very mild-  It is not bothersome to the patient. Family primarily notices it. Will continue to monitor the tremor over time.  If patient symptoms worsen or she develops new symptoms they should let us know.  Follow-up in 6 months or sooner if needed.   Butch PennyMegan Stephany Poorman, MSN, NP-C 10/12/2014, 10:37 AM [facility]  [address], [place], [place] [address] [phone]  Note: This document was prepared with digital dictation and possible smart phrase technology. Any transcriptional errors that result from this process are unintentional.

## 2014-10-12 NOTE — Patient Instructions (Signed)

## 2014-10-12 NOTE — Progress Notes (Signed)
I agree with the assessment and plan as directed by NP .The patient is known to me .   Christianne Zacher, MD  

## 2014-10-20 ENCOUNTER — Telehealth: Payer: Self-pay | Admitting: Adult Health

## 2014-10-20 NOTE — Telephone Encounter (Signed)
The patient's spouse called stating that she was already taking aricept. In our office visit the husband did not have this medication written down. She is actually taking Aricept 10 mg daily. I will update her records

## 2014-11-02 ENCOUNTER — Encounter: Payer: Self-pay | Admitting: Neurology

## 2014-11-08 ENCOUNTER — Encounter: Payer: Self-pay | Admitting: Neurology

## 2015-03-23 ENCOUNTER — Other Ambulatory Visit: Payer: Self-pay | Admitting: Obstetrics and Gynecology

## 2015-03-24 LAB — CYTOLOGY - PAP

## 2015-03-31 ENCOUNTER — Ambulatory Visit: Payer: Medicare Other | Admitting: Adult Health

## 2015-04-03 ENCOUNTER — Encounter: Payer: Self-pay | Admitting: Nurse Practitioner

## 2015-04-03 ENCOUNTER — Ambulatory Visit (INDEPENDENT_AMBULATORY_CARE_PROVIDER_SITE_OTHER): Payer: Medicare Other | Admitting: Nurse Practitioner

## 2015-04-03 VITALS — BP 155/82 | HR 84 | Ht 67.0 in | Wt 172.8 lb

## 2015-04-03 DIAGNOSIS — R413 Other amnesia: Secondary | ICD-10-CM | POA: Diagnosis not present

## 2015-04-03 MED ORDER — DONEPEZIL HCL 10 MG PO TABS: 10.0000 mg | ORAL_TABLET | Freq: Every day | ORAL | Status: DC

## 2015-04-03 NOTE — Progress Notes (Addendum)
[facility]  PATIENT: Jamie HatcherHelen Barber DOB: Apr 28, 1932   REASON FOR VISIT: Follow-up for memory loss,  HISTORY FROM: Daughter, husband and patient   HISTORY OF PRESENT ILLNESS:[name] is an 81 year old female with a history of memory loss. She returns today for follow-up. She is currently taking Namenda and Aricept 5mg . Her dose of Aricept was not titrated to 10 mg. She reports that her memory is better. MMSE has improved. She is able to complete all ADLs independently. Husband does help her get in and out of the bathtub due to a recent hip surgery. She denies having to give up anything due to her memory. Husband, daughter and grandson cook for the patient. Husband does most of the cleaning in the house. Patient and her husband do the laundry. She broke her hip in February 2015.  She has not driven since she broke her hip. She continues to go to church every Sunday. Husband notes that she has a tremor in the chin. She also has a tremor in the right hand that has affected her hand writing and her ability to eat. Husband notices the tremor mostly in the chin. She sleeps well at night. Denies acting out her dreams. She has not had further stroke or TIA symptoms.    REVIEW OF SYSTEMS: Full 14 system review of systems performed and notable only for those listed, all others are neg:  Constitutional: neg  Cardiovascular: neg Ear/Nose/Throat: neg  Skin: neg Eyes: neg Respiratory: neg Gastroitestinal: neg  Hematology/Lymphatic: neg  Endocrine: neg Musculoskeletal:neg Allergy/Immunology: neg Neurological:  Memory loss, essential tremor Psychiatric:  Decreased concentration, confusion Sleep : neg   ALLERGIES: No Known Allergies  HOME MEDICATIONS: Outpatient Prescriptions Prior to Visit  Medication Sig Dispense Refill  . aspirin EC 325 MG EC tablet Take 1 tablet (325 mg total) by mouth 2 (two) times daily. 30 tablet 0  . Cyanocobalamin (VITAMIN B 12 PO) Take 1  tablet by mouth daily.     Marland Kitchen. LORazepam (ATIVAN) 1 MG tablet     . Memantine HCl ER (NAMENDA XR) 28 MG CP24 Take 28 mg by mouth at bedtime.    . pantoprazole (PROTONIX) 40 MG tablet Take 40 mg by mouth daily.     . raloxifene (EVISTA) 60 MG tablet     . Vitamin D, Cholecalciferol, 1000 UNITS TABS Take 2,000 Units by mouth daily.    Marland Kitchen. AFLURIA SUSP     . BOOSTRIX 5-2.5-18.5 injection     . donepezil (ARICEPT) 10 MG tablet Take 10 mg by mouth at bedtime.     No facility-administered medications prior to visit.    PAST MEDICAL HISTORY: Past Medical History  Diagnosis Date  . Anxiety   . GERD (gastroesophageal reflux disease)   . Insomnia   . Memory loss   . Lumbago   . dementia   . Stroke 1985    no deficits    PAST SURGICAL HISTORY: Past Surgical History  Procedure Laterality Date  . Tonsillectomy    . Adenoidectomy    . Cholecystectomy    . Total vaginal hysterectomy    . Appendectomy    . Abdominal hysterectomy    . Hip arthroplasty Left 02/07/2014    Procedure: ARTHROPLASTY BIPOLAR HIP;  Surgeon: Shelda PalMatthew D Olin, MD;  [facility];  Service: Orthopedics;  Laterality: Left;    FAMILY HISTORY: Family History  Problem Relation Age of Onset  . Cancer Mother [id number]  . Cancer Father [phone]  .  Cancer Father     SOCIAL HISTORY: History   Social History  . Marital Status: Married    Spouse Name: N/A  . Number of Children: 2  . Years of Education: 12   Occupational History  . Retired     Social History Main Topics  . Smoking status: Never Smoker   . Smokeless tobacco: Never Used  . Alcohol Use: No  . Drug Use: No  . Sexual Activity: Not on file   Other Topics Concern  . Not on file   Social History Narrative   Patient lives at home with husband    Patient has 2 children.    Patient has a high school education.    Patient is left handed.      PHYSICAL EXAM  Filed Vitals:   04/03/15 0813  BP: 155/82  Pulse: 84  Height:  (1.702 m)  Weight: 172 lb  12.8 oz (78.382 kg)   Body mass index is 27.06 kg/(m^2). Generalized: Well developed, in no acute distress   Neurological examination  Mentation: Alert. Follows all commands speech and language fluent. MMSE 21/30. AFT 4. Clock drawing 0/0 Cranial nerve II-XII: Pupils were equal round reactive to light. Extraocular movements were full, visual field were full on confrontational test. Facial sensation and strength were normal. Uvula tongue midline. Head turning and shoulder shrug were normal and symmetric. Motor: The motor testing reveals 5 over 5 strength of all 4 extremities. Good symmetric motor tone is noted throughout. Mild tremor noted in the chin, intermittent tremor noted mainly in the left thumb. No cogwheeling no rigor Sensory: Sensory testing is intact to soft touch on all 4 extremities. No evidence of extinction is noted.  Coordination: Cerebellar testing reveals good finger-nose-finger and heel-to-shin bilaterally.  Gait and station: Gait is normal. Romberg is negative. No drift is seen. Can heel toe and tandem without difficulty Reflexes: Deep tendon reflexes are symmetric and normal bilaterally.     DIAGNOSTIC DATA (LABS, IMAGING, TESTING) -  ASSESSMENT AND PLAN  81 y.o. year old female  has a past medical history of Anxiety;  Insomnia; Memory loss; dementia; essential tremor and Stroke (1985). here  to follow-up. Her memory score is stable. The patient is a current patient of Dr. Vickey Huger who is out of the office today . This note is sent to the work in doctor.     Increase Aricept to 10 mg daily will refill for 6 months Continue Namenda at current dose Tremor is mild will continue to monitor over time, no signs of Parkinson's. Follow-up in 6 months or sooner if problems arise Nilda Riggs, [facility], [facility], APRN  [facility] [address] [place], [place] [address] [phone]  I reviewed the above note and documentation by the Nurse  Practitioner and agree with the history, physical exam, assessment and plan as outlined above. I was immediately available for face-to-face consultation. Huston Foley, MD, PhD [facility])

## 2015-04-03 NOTE — Patient Instructions (Signed)
Increase Aricept to 10 mg daily will refill for 6 months Continue Namenda at current dose Follow-up in 6 months or sooner if problems arise

## 2015-10-04 ENCOUNTER — Ambulatory Visit (INDEPENDENT_AMBULATORY_CARE_PROVIDER_SITE_OTHER): Payer: Medicare Other | Admitting: Nurse Practitioner

## 2015-10-04 ENCOUNTER — Encounter: Payer: Self-pay | Admitting: Nurse Practitioner

## 2015-10-04 VITALS — BP 145/87 | HR 116 | Ht 67.0 in | Wt 171.4 lb

## 2015-10-04 DIAGNOSIS — F068 Other specified mental disorders due to known physiological condition: Secondary | ICD-10-CM

## 2015-10-04 DIAGNOSIS — R269 Unspecified abnormalities of gait and mobility: Secondary | ICD-10-CM | POA: Diagnosis not present

## 2015-10-04 DIAGNOSIS — R413 Other amnesia: Secondary | ICD-10-CM

## 2015-10-04 DIAGNOSIS — F0391 Unspecified dementia with behavioral disturbance: Secondary | ICD-10-CM

## 2015-10-04 DIAGNOSIS — F03918 Unspecified dementia, unspecified severity, with other behavioral disturbance: Secondary | ICD-10-CM

## 2015-10-04 DIAGNOSIS — F0390 Unspecified dementia without behavioral disturbance: Secondary | ICD-10-CM

## 2015-10-04 MED ORDER — MEMANTINE HCL ER 28 MG PO CP24: 28.0000 mg | ORAL_CAPSULE | Freq: Every day | ORAL | Status: AC

## 2015-10-04 MED ORDER — DONEPEZIL HCL 10 MG PO TABS: 10.0000 mg | ORAL_TABLET | Freq: Every day | ORAL | Status: AC

## 2015-10-04 NOTE — Progress Notes (Signed)
[facility]  PATIENT: [name] DOB: Aug 09, 1932   REASON FOR VISIT: follow up for memory loss , behavior issues, gait abnormality HISTORY FROM:patient and daughter, husband    HISTORY OF PRESENT ILLNESS: [name], 81 year old female returns for follow-up with her husband and daughter. She was last seen in our office 04/03/2015. MMSE score at that time was 21/30. Today is 11/30. She is requiring assistance and supervision for all activities of daily living. She is eating well and has not lost any weight. She has more agitation and behavior issues at night and recently her Ativan was increased to twice a day. She has a mild benign essential tremor and she was recently placed on primidone 50 mg twice daily by Dr. Kevan Ny. Husband reports that she has gotten out of the house and down to the mailbox or to her sister's house without his knowledge several times. He is concerned for her safety. Daughter has multiple questions as well. She is currently on Aricept 10 mg and Namenda XR 28 mg daily. No recent falls, she is using a single-point cane She returns for reevaluation   HISTORY: [name] is an 81 year old female with a history of memory loss. She returns today for follow-up. She is currently taking Namenda and Aricept . Her dose of Aricept was not titrated to 10 mg. She reports that her memory is better. MMSE has improved. She is able to complete all ADLs independently. Husband does help her get in and out of the bathtub due to a recent hip surgery. She denies having to give up anything due to her memory. Husband, daughter and grandson cook for the patient. Husband does most of the cleaning in the house. Patient and her husband do the laundry. She broke her hip in February 2015.  She has not driven since she broke her hip. She continues to go to church every Sunday. Husband notes that she has a tremor in the chin. She also has a tremor in the right hand that has affected  her hand writing and her ability to eat. Husband notices the tremor mostly in the chin. She sleeps well at night. Denies acting out her dreams. She has not had further stroke or TIA symptoms.   REVIEW OF SYSTEMS: Full 14 system review of systems performed and notable only for those listed, all others are neg:  Constitutional: neg  Cardiovascular: neg Ear/Nose/Throat: neg  Skin: neg Eyes: neg Respiratory: neg Gastroitestinal: neg  Hematology/Lymphatic: neg  Endocrine: neg Musculoskeletal: Joint pain, gait abnormality Allergy/Immunology: neg Neurological: Memory loss Psychiatric: Wandering behavior  Sleep : neg   ALLERGIES: No Known Allergies  HOME MEDICATIONS: Outpatient Prescriptions Prior to Visit  Medication Sig Dispense Refill  . AFLURIA SUSP     . aspirin EC 325 MG EC tablet Take 1 tablet (325 mg total) by mouth 2 (two) times daily. 30 tablet 0  . BOOSTRIX 5-2.5-18.5 injection     . Cyanocobalamin (VITAMIN B 12 PO) Take 1 tablet by mouth daily.     Marland Kitchen donepezil (ARICEPT) 10 MG tablet Take 1 tablet (10 mg total) by mouth at bedtime. 30 tablet 7  . LORazepam (ATIVAN) 1 MG tablet     . Memantine HCl ER (NAMENDA XR) 28 MG CP24 Take 28 mg by mouth at bedtime.    . pantoprazole (PROTONIX) 40 MG tablet Take 40 mg by mouth daily.     . raloxifene (EVISTA) 60 MG tablet     . Vitamin D, Cholecalciferol,  1000 UNITS TABS Take 2,000 Units by mouth daily.     No facility-administered medications prior to visit.    PAST MEDICAL HISTORY: Past Medical History  Diagnosis Date  . Anxiety   . GERD (gastroesophageal reflux disease)   . Insomnia   . Memory loss   . Lumbago   . dementia   . Stroke (HCC) 1985    no deficits    PAST SURGICAL HISTORY: Past Surgical History  Procedure Laterality Date  . Tonsillectomy    . Adenoidectomy    . Cholecystectomy    . Total vaginal hysterectomy    . Appendectomy    . Abdominal hysterectomy    . Hip arthroplasty Left 02/07/2014     Procedure: ARTHROPLASTY BIPOLAR HIP;  Surgeon: Shelda PalMatthew D Olin, MD;  [facility];  Service: Orthopedics;  Laterality: Left;    FAMILY HISTORY: Family History  Problem Relation Age of Onset  . Cancer Mother [id number]  . Cancer Father [id number]  . Cancer Father     SOCIAL HISTORY: Social History   Social History  . Marital Status: Married    Spouse Name: N/A  . Number of Children: 2  . Years of Education: 12   Occupational History  . Retired     Social History Main Topics  . Smoking status: Never Smoker   . Smokeless tobacco: Never Used  . Alcohol Use: No  . Drug Use: No  . Sexual Activity: Not on file   Other Topics Concern  . Not on file   Social History Narrative   Patient lives at home with husband    Patient has 2 children.    Patient has a high school education.    Patient is left handed.      PHYSICAL EXAM  Filed Vitals:   10/04/15 0740  BP: 145/87  Pulse: 116  Height: 5\' 7"  (1.702 m)  Weight: 171 lb 6.4 oz (77.747 kg)   Body mass index is 26.84 kg/(m^2). Generalized: Well developed, in no acute distress   Neurological examination  Mentation: Alert. Follows all commands speech and language fluent. MMSE 11/30 Last was 21/30. AFT 3. Clock drawing 0/0 Cranial nerve II-XII: Pupils were equal round reactive to light. Extraocular movements were full, visual field were full on confrontational test. Facial sensation and strength were normal. Uvula tongue midline. Head turning and shoulder shrug were normal and symmetric. Motor: The motor testing reveals 5 over 5 strength of all 4 extremities. Good symmetric motor tone is noted throughout. Mild tremor noted in the chin, intermittent tremor noted mainly in the left thumb. No cogwheeling no rigor Sensory: Sensory testing is intact to soft touch on all 4 extremities. No evidence of extinction is noted.  Coordination: Cerebellar testing reveals good finger-nose-finger and heel-to-shin bilaterally.  Gait and station: Gait  is wide based . Romberg is negative. No drift is seen. Tandem is unsteady, she ambulates with single-point cane Reflexes: Deep tendon reflexes are symmetric and normal bilaterally.    DIAGNOSTIC DATA (LABS, IMAGING, TESTING) -   ASSESSMENT AND PLAN  81 y.o. year old female  has a past medical history of Anxiety;  Insomnia; Memory loss;  dementia; and Stroke (HCC) (1985). here to follow-up.   Continue Aricept to 10 mg daily will refill for 6 months Continue Namenda at current dose will refill Tremor is mild will continue to monitor over time, no signs of Parkinson's. Decrease primidone to once daily to see if her current confusion  is less I spent  additional 15 minutes in total face to face time with the patient/ husband and daughter more than 50% of which was spent counseling and coordination of care, reviewing test results reviewing medications and discussing and reviewing the diagnosis of progressive dementia and further treatment options. I am concerned for her husband who is tiring as a caregiver suggestions to look to the church community for respite several hours daily. Adult daycare centers are  Available if the  patient is willing to go. Recommend that he get alarms or additional locks  For the doors to prevent her from getting out without supervision . Also recommend family come together for plan B in case something happens to the caregiver.,Given information on caregiver resources. All questions were answered. Follow-up in 6 months or sooner if problems arise.  Next visit with Dr. Vickey Huger Vst time 30 min Nilda Riggs, [facility], [facility], APRN  [facility] [address] [place], [place] [address] [phone]

## 2015-10-04 NOTE — Progress Notes (Signed)
I agree with the assessment and plan as directed by NP .The patient is known to me .   Bernie Ransford, MD  

## 2015-10-04 NOTE — Patient Instructions (Signed)
Continue Aricept to 10 mg daily will refill for 6 months Continue Namenda at current dose will refill Tremor is mild will continue to monitor over time, no signs of Parkinson's. Decrease Primidone to once daily  Given information on caregiver resources  Follow-up in 6 months or sooner if problems arise Next visit with Dr. Vickey Hugerohmeier

## 2016-02-09 ENCOUNTER — Emergency Department (HOSPITAL_COMMUNITY)
Admission: EM | Admit: 2016-02-09 | Discharge: 2016-02-09 | Disposition: A | Discharge status: Home / Self Care | Payer: Medicare Other | Attending: Emergency Medicine | Admitting: Emergency Medicine

## 2016-02-09 ENCOUNTER — Emergency Department (HOSPITAL_COMMUNITY): Payer: Medicare Other

## 2016-02-09 ENCOUNTER — Encounter (HOSPITAL_COMMUNITY): Payer: Self-pay | Admitting: *Deleted

## 2016-02-09 ENCOUNTER — Telehealth: Payer: Self-pay | Admitting: *Deleted

## 2016-02-09 DIAGNOSIS — R531 Weakness: Secondary | ICD-10-CM | POA: Diagnosis not present

## 2016-02-09 DIAGNOSIS — Z8673 Personal history of transient ischemic attack (TIA), and cerebral infarction without residual deficits: Secondary | ICD-10-CM | POA: Insufficient documentation

## 2016-02-09 DIAGNOSIS — G4700 Insomnia, unspecified: Secondary | ICD-10-CM | POA: Diagnosis not present

## 2016-02-09 DIAGNOSIS — F419 Anxiety disorder, unspecified: Secondary | ICD-10-CM | POA: Diagnosis not present

## 2016-02-09 DIAGNOSIS — F0391 Unspecified dementia with behavioral disturbance: Secondary | ICD-10-CM

## 2016-02-09 DIAGNOSIS — K219 Gastro-esophageal reflux disease without esophagitis: Secondary | ICD-10-CM | POA: Diagnosis not present

## 2016-02-09 DIAGNOSIS — Z79899 Other long term (current) drug therapy: Secondary | ICD-10-CM | POA: Insufficient documentation

## 2016-02-09 DIAGNOSIS — Z7982 Long term (current) use of aspirin: Secondary | ICD-10-CM | POA: Insufficient documentation

## 2016-02-09 DIAGNOSIS — R4182 Altered mental status, unspecified: Secondary | ICD-10-CM | POA: Diagnosis present

## 2016-02-09 LAB — URINALYSIS, ROUTINE W REFLEX MICROSCOPIC
Bilirubin Urine: NEGATIVE
Glucose, UA: NEGATIVE mg/dL
Hgb urine dipstick: NEGATIVE
Ketones, ur: NEGATIVE mg/dL
Leukocytes, UA: NEGATIVE
Nitrite: NEGATIVE
Protein, ur: NEGATIVE mg/dL
Specific Gravity, Urine: 1.008 (ref 1.005–1.030)
pH: 7 (ref 5.0–8.0)

## 2016-02-09 LAB — CBC WITH DIFFERENTIAL/PLATELET
Basophils Absolute: 0 K/uL (ref 0.0–0.1)
Basophils Relative: 1 %
Eosinophils Absolute: 0.1 K/uL (ref 0.0–0.7)
Eosinophils Relative: 2 %
HCT: 37.7 % (ref 36.0–46.0)
Hemoglobin: 11.9 g/dL — ABNORMAL LOW (ref 12.0–15.0)
Lymphocytes Relative: 29 %
Lymphs Abs: 1.7 K/uL (ref 0.7–4.0)
MCH: 28.7 pg (ref 26.0–34.0)
MCHC: 31.6 g/dL (ref 30.0–36.0)
MCV: 90.8 fL (ref 78.0–100.0)
Monocytes Absolute: 0.5 K/uL (ref 0.1–1.0)
Monocytes Relative: 8 %
Neutro Abs: 3.5 K/uL (ref 1.7–7.7)
Neutrophils Relative %: 60 %
Platelets: 267 K/uL (ref 150–400)
RBC: 4.15 MIL/uL (ref 3.87–5.11)
RDW: 13.1 % (ref 11.5–15.5)
WBC: 5.8 K/uL (ref 4.0–10.5)

## 2016-02-09 LAB — BASIC METABOLIC PANEL WITH GFR
Anion gap: 9 (ref 5–15)
BUN: 9 mg/dL (ref 6–20)
CO2: 26 mmol/L (ref 22–32)
Calcium: 8.8 mg/dL — ABNORMAL LOW (ref 8.9–10.3)
Chloride: 107 mmol/L (ref 101–111)
Creatinine, Ser: 0.68 mg/dL (ref 0.44–1.00)
GFR calc Af Amer: >60 mL/min
GFR calc non Af Amer: >60 mL/min
Glucose, Bld: 87 mg/dL (ref 65–99)
Potassium: 3.7 mmol/L (ref 3.5–5.1)
Sodium: 142 mmol/L (ref 135–145)

## 2016-02-09 LAB — CBG MONITORING, ED: Glucose-Capillary: 73 mg/dL (ref 65–99)

## 2016-02-09 MED ORDER — SODIUM CHLORIDE 0.9 % IV BOLUS (SEPSIS)
500.0000 mL | Freq: Once | INTRAVENOUS | Status: AC
  Administered 2016-02-09: 500 mL via INTRAVENOUS

## 2016-02-09 NOTE — Discharge Instructions (Signed)
Dementia °Dementia is a word that is used to describe problems with the brain and how it works. People with dementia have memory loss. They may also have problems with thinking, speaking, or solving problems. It can affect how they act around people, how they do their job, their mood, and their personality. These changes may not show up for a long time. Family or friends may not notice problems in the early part of this disease. °HOME CARE °The following tips are for the person living with, or caring for, the person with dementia. °Make the home safe. °· Remove locks on bathroom doors. °· Use childproof locks on cabinets where alcohol, cleaning supplies, or chemicals are stored. °· Put outlet covers in electrical outlets. °· Put in childproof locks to keep doors and windows safe. °· Remove stove knobs, or put in safety knobs that shut off on their own. °· Lower the temperature on water heaters. °· Label medicines. Lock them in a safe place. °· Keep knives, lighters, matches, power tools, and guns out of reach or in a safe place. °· Remove objects that might break or can hurt the person. °· Make sure lighting is good inside and outside. °· Put in grab bars if needed. °· Use a device that detects falls or other needs for help. °Lessen confusion. °· Keep familiar objects and people around. °· Use night lights or low lit (dim) lights at night. °· Label objects or areas. °· Use reminders, notes, or directions for daily activities or tasks. °· Keep a simple routine that is the same for waking, meals, bathing, dressing, and bedtime. °· Create a calm and quiet home. °· Put up clocks and calendars. °· Keep emergency numbers and the home address near all phones. °· Help show the different times of day. Open the curtains during the day to let light in. °Speak clearly and directly. °· Choose simple words and short sentences. °· Use a gentle, calm voice. °· Do not interrupt. °· If the person has a hard time finding a word to  use, give them the word or thought. °· Ask 1 question at a time. Give enough time for the person to answer. Repeat the question if the person does not answer. °Do things that lessen restlessness. °· Provide a comfortable bed. °· Have the same bedtime routine every night. °· Have a regular walking and activity schedule. °· Lessen naps during the day. °· Do not let the person drink a lot of caffeine. °· Go to events that are not overwhelming. °Eat well and drink fluids. °· Lessen distractions during meal times and snacks. °· Avoid foods that are too hot or too cold. °· Watch how the person chews and swallows. This is to make sure they do not choke. °Other °· Keep all vision, hearing, dental, and medical visits with the doctor. °· Only give medicines as told by the doctor. °· Watch the person's driving ability. Do not let the person drive if he or she cannot drive safely. °· Use a program that helps find a person if they become missing. You may need to register with this program. °GET HELP RIGHT AWAY IF:  °· A fever of 102° F (38.9° C) develops. °· Confusion develops or gets worse. °· Sleepiness develops or gets worse. °· Staying awake is hard to do. °· New behavior problems start like mood swings, aggression, and seeing things that are not there. °· Problems with balance, speech, or falling develop. °· Problems swallowing develop. °· Any   problems of another sickness develop. MAKE SURE YOU:  Understand these instructions.  Will watch his or her condition.  Will get help right away if he or she is not doing well or gets worse.   This information is not intended to replace advice given to you by your health care provider. Make sure you discuss any questions you have with your health care provider.   Document Released: 11/14/2008 Document Revised: 02/24/2012 Document Reviewed: 04/29/2011 Elsevier Interactive Patient Education 2016 Elsevier Inc.  Weakness Weakness is a lack of strength. You may feel weak  all over your body or just in one part of your body. Weakness can be serious. In some cases, you may need more medical tests. HOME CARE  Rest.  Eat a well-balanced diet.  Try to exercise every day.  Only take medicines as told by your doctor. GET HELP RIGHT AWAY IF:   You cannot do your normal daily activities.  You cannot walk up and down stairs, or you feel very tired when you do so.  You have shortness of breath or chest pain.  You have trouble moving parts of your body.  You have weakness in only one body part or on only one side of the body.  You have a fever.  You have trouble speaking or swallowing.  You cannot control when you pee (urinate) or poop (bowel movement).  You have black or bloody throw up (vomit) or poop.  Your weakness gets worse or spreads to other body parts.  You have new aches or pains. MAKE SURE YOU:   Understand these instructions.  Will watch your condition.  Will get help right away if you are not doing well or get worse.   This information is not intended to replace advice given to you by your health care provider. Make sure you discuss any questions you have with your health care provider.   Follow up with your PCP for re-evaluation. Return to the ED if you experience severe worsening of your symptoms, fever, loss of consciousness, vomiting, chest pain, difficulty breathing.

## 2016-02-09 NOTE — ED Provider Notes (Signed)
CSN: [id number]     Arrival date & time 02/09/16  1154 History   First MD Initiated Contact with Patient 02/09/16 1201     Chief Complaint  Patient presents with  . Altered Mental Status     (Consider location/radiation/quality/duration/timing/severity/associated sxs/prior Treatment) HPI   Barb Shear is a 82 y.o F with a pmhx of severe dementia, CVA who presents to the emergency department today for increased weakness. Level V caveat, dementia. Patient's husband provides most of the history. He states that last night patient was talking in her sleep which is unusual for her. Today when patient woke up she seemed "out of it". She appeared to be more weak than normal and uninterested in walking around. Patient ate breakfast this morning but it was less than the normal amount that she typically eats.  Of note, patient husband states that one week ago patient had a syncopal episode. She was sitting on the edge of her bed when her eyes rolled to the back of her head and she fell backwards onto the bed and passed out. Patient's husband states that she was out for 2-3 seconds and then came to. Patient has not had any similar symptoms since that time. No hx of heart disease.   Past Medical History  Diagnosis Date  . Anxiety   . GERD (gastroesophageal reflux disease)   . Insomnia   . Memory loss   . Lumbago   . dementia   . Stroke [facility]) 1985    no deficits   Past Surgical History  Procedure Laterality Date  . Tonsillectomy    . Adenoidectomy    . Cholecystectomy    . Total vaginal hysterectomy    . Appendectomy    . Abdominal hysterectomy    . Hip arthroplasty Left 02/07/2014    Procedure: ARTHROPLASTY BIPOLAR HIP;  Surgeon: Shelda Pal, MD;  Location: [facility] OR;  Service: Orthopedics;  Laterality: Left;   Family History  Problem Relation Age of Onset  . Cancer Mother 29  . Cancer Father 98  . Cancer Father    Social History  Substance Use Topics  . Smoking status: Never Smoker    . Smokeless tobacco: Never Used  . Alcohol Use: No   OB History    No data available     Review of Systems  All other systems reviewed and are negative.     Allergies  Review of patient's allergies indicates no known allergies.  Home Medications   Prior to Admission medications   Medication Sig Start Date End Date Taking? Authorizing Provider  AFLURIA SUSP  09/16/14   Historical Provider, MD  aspirin EC 325 MG EC tablet Take 1 tablet (325 mg total) by mouth 2 (two) times daily. 02/11/14   Marden Noble, MD  BOOSTRIX 5-2.5-18.5 injection  08/11/14   Historical Provider, MD  Cyanocobalamin (VITAMIN B 12 PO) Take 1 tablet by mouth daily.     Historical Provider, MD  divalproex (DEPAKOTE) 125 MG DR tablet  08/25/15   Historical Provider, MD  donepezil (ARICEPT) 10 MG tablet Take 1 tablet (10 mg total) by mouth at bedtime. 10/04/15   Nilda Riggs, NP  LORazepam (ATIVAN) 1 MG tablet Take 1 mg by mouth 2 (two) times daily.  09/12/14   Historical Provider, MD  memantine (NAMENDA XR) 28 MG CP24 24 hr capsule Take 1 capsule (28 mg total) by mouth at bedtime. 10/04/15   Nilda Riggs, NP  pantoprazole (PROTONIX) 40 MG tablet Take  40 mg by mouth daily.  10/07/13   Historical Provider, MD  primidone (MYSOLINE) 50 MG tablet Take 50 mg by mouth 2 (two) times daily.  09/23/15   Historical Provider, MD  raloxifene (EVISTA) 60 MG tablet  09/26/14   Historical Provider, MD  Vitamin D, Cholecalciferol, 1000 UNITS TABS Take 2,000 Units by mouth daily.    Historical Provider, MD   BP 140/59 mmHg  Temp(Src) 97.6 F (36.4 C) (Axillary)  Resp 18  SpO2 98% Physical Exam  Constitutional: She appears well-developed and well-nourished. No distress.  HENT:  Head: Normocephalic and atraumatic.  Mouth/Throat: No oropharyngeal exudate.  Eyes: Conjunctivae and EOM are normal. Pupils are equal, round, and reactive to light. Right eye exhibits no discharge. Left eye exhibits no discharge. No scleral  icterus.  Cardiovascular: Normal rate, regular rhythm, normal heart sounds and intact distal pulses.  Exam reveals no gallop and no friction rub.   No murmur heard. Pulmonary/Chest: Effort normal and breath sounds normal. No respiratory distress. She has no wheezes. She has no rales. She exhibits no tenderness.  Abdominal: Soft. She exhibits no distension. There is no tenderness. There is no guarding.  Musculoskeletal: Normal range of motion. She exhibits no edema.  Neurological: She is alert. She has normal reflexes. No cranial nerve deficit. She exhibits normal muscle tone.  Severe dementia, disoriented. Strength 5/5 throughout. No sensory deficits. No gait abnormality. Normal finger to nose. No pronator drift. No facial droop.  Skin: Skin is warm and dry. No rash noted. She is not diaphoretic. No erythema. No pallor.  Psychiatric: She has a normal mood and affect. Her behavior is normal.  Nursing note and vitals reviewed.   ED Course  Procedures (including critical care time) Labs Review Labs Reviewed  BASIC METABOLIC PANEL - Abnormal; Notable for the following:    Calcium 8.8 (*)    All other components within normal limits  CBC WITH DIFFERENTIAL/PLATELET - Abnormal; Notable for the following:    Hemoglobin 11.9 (*)    All other components within normal limits  URINALYSIS, ROUTINE W REFLEX MICROSCOPIC (NOT AT [facility])  CBG MONITORING, ED    Imaging Review Ct Head Wo Contrast  02/09/2016  CLINICAL DATA:  Confusion.  History of dementia EXAM: CT HEAD WITHOUT CONTRAST TECHNIQUE: Contiguous axial images were obtained from the base of the skull through the vertex without intravenous contrast. COMPARISON:  January 17, 2011 FINDINGS: Moderate diffuse atrophy is stable. There is no intracranial mass, hemorrhage, extra-axial fluid collection, or midline shift. There is patchy small vessel disease in the centra semiovale bilaterally. Elsewhere, gray-white compartments appear normal. No acute  infarct is evident. There are basal ganglia calcifications bilaterally, a stable finding. The bony calvarium appears intact. The mastoid air cells are clear. No intraorbital lesions are identified. IMPRESSION: Moderate atrophy with patchy small vessel disease in the centra semiovale bilaterally. No intracranial mass, hemorrhage, or acute appearing infarct. Basal ganglia calcification is likely physiologic in this age group, stable. Electronically Signed   By: Bretta Bang III M.D.   On: 02/09/2016 13:36   I have personally reviewed and evaluated these images and lab results as part of my medical decision-making.   EKG Interpretation   Date/Time:  Friday February 09 2016 12:11:12 EST Ventricular Rate:  73 PR Interval:  138 QRS Duration: 78 QT Interval:  396 QTC Calculation: 436 R Axis:   -9 Text Interpretation:  Normal sinus rhythm Nonspecific T wave abnormality  Abnormal ECG No significant change since last tracing  Confirmed by Adela Lank  MD, Reuel Boom [id number]) on 02/09/2016 1:04:04 PM      MDM   Final diagnoses:  Weakness  Dementia, with behavioral disturbance   82 year old female with history of severe dementia presents to the ED with reported symptoms of weakness. Patient's husband states that she was talking in her sleep last night which is unusual and today seemed "out of it". Patient was uninterested in walking today which is unusual. Patient is unable to provide any history due to dementia. No neurological deficits seen on exam. All blood work within normal limits. No urinary tract infection. CT head unremarkable. Patient is ambulatory in ED without difficulty. Unable to find objective reason for patient's reported increased weakness. Feel the patient is safe for discharge with follow-up with primary care doctor. Patient's family is requesting hospice consult. I spoke with case management department who states that per [facility], they will not to emergency room consults for  severe dementia. Patient also does not meet criteria for hospice for her dementia currently. Discussed the case with patient's family and they will follow up with patient's PCP. Return precautions outlined in patient discharge instructions.  Patient was discussed with and seen by Dr. Adela Lank who agrees with the treatment plan.      Lester Kinsman Cashion, PA-C 02/09/16 1757  Melene Plan, DO 02/10/16 [phone]

## 2016-02-09 NOTE — ED Notes (Signed)
Pt assisted to ambulate to rest room for urine sample, pt missed urine collection bowel in toilet, unable to obtain UA at this time. Dr. Adela Lank, EDP made aware.

## 2016-02-09 NOTE — Progress Notes (Signed)
1639 ED CM noted pt cm consult for hospice ED CM spoke with ED PA Aldona Bar Question if pt meeting hospice criteria ED CM spoke with Becky at [facility] [phone] ED CM reviewed information in EDPA note to see if pt met criteria for hospice 1641 Becky reviewed with CM that criteria for severe dementia patients includes some of these criteria among others: Pt most have extreme weight loss, be in fetal position, not speaking in sentences, unable to feed self, incontinence Jacqlyn Larsen confirms pt would need to speak with pt's pcp not EDP/PA/NP to get services started [place] recommends family and/or CM provided referral information and pcp to be called on "monday" 1702 spoke with Maudie Mercury [facility] ED RN to update her on information from [facility]. Maudie Mercury states husband stated he would contact pcp and pt left [facility] ED CM to fax referral to [place] will have information to share with pt's pcp on Monday

## 2016-02-09 NOTE — ED Notes (Signed)
Pt to CT

## 2016-02-09 NOTE — ED Notes (Signed)
Per [facility] - pt from home - reported to have AMS and increased confusion, pt w/ hx of dementia however family reports pt's current mental status not pt's normal baseline. Pt oriented only on arrival, pt's husband at bedside.

## 2016-02-16 ENCOUNTER — Emergency Department (HOSPITAL_COMMUNITY): Payer: Medicare Other

## 2016-02-16 ENCOUNTER — Encounter (HOSPITAL_COMMUNITY): Payer: Self-pay

## 2016-02-16 ENCOUNTER — Inpatient Hospital Stay (HOSPITAL_COMMUNITY)
Admission: EM | Admit: 2016-02-16 | Discharge: 2016-02-19 | DRG: 470 | Disposition: A | Discharge status: Skilled Nursing Facility | Payer: Medicare Other | Attending: Internal Medicine | Admitting: Internal Medicine

## 2016-02-16 DIAGNOSIS — S72001A Fracture of unspecified part of neck of right femur, initial encounter for closed fracture: Secondary | ICD-10-CM | POA: Diagnosis present

## 2016-02-16 DIAGNOSIS — Z8673 Personal history of transient ischemic attack (TIA), and cerebral infarction without residual deficits: Secondary | ICD-10-CM

## 2016-02-16 DIAGNOSIS — W19XXXA Unspecified fall, initial encounter: Secondary | ICD-10-CM | POA: Diagnosis present

## 2016-02-16 DIAGNOSIS — Z79899 Other long term (current) drug therapy: Secondary | ICD-10-CM | POA: Diagnosis not present

## 2016-02-16 DIAGNOSIS — S0181XA Laceration without foreign body of other part of head, initial encounter: Secondary | ICD-10-CM | POA: Diagnosis present

## 2016-02-16 DIAGNOSIS — S72011A Unspecified intracapsular fracture of right femur, initial encounter for closed fracture: Secondary | ICD-10-CM | POA: Diagnosis present

## 2016-02-16 DIAGNOSIS — Z96642 Presence of left artificial hip joint: Secondary | ICD-10-CM | POA: Diagnosis present

## 2016-02-16 DIAGNOSIS — Z66 Do not resuscitate: Secondary | ICD-10-CM | POA: Diagnosis present

## 2016-02-16 DIAGNOSIS — S7290XA Unspecified fracture of unspecified femur, initial encounter for closed fracture: Secondary | ICD-10-CM | POA: Insufficient documentation

## 2016-02-16 DIAGNOSIS — K219 Gastro-esophageal reflux disease without esophagitis: Secondary | ICD-10-CM | POA: Diagnosis present

## 2016-02-16 DIAGNOSIS — Y92009 Unspecified place in unspecified non-institutional (private) residence as the place of occurrence of the external cause: Secondary | ICD-10-CM | POA: Diagnosis not present

## 2016-02-16 DIAGNOSIS — I471 Supraventricular tachycardia: Secondary | ICD-10-CM | POA: Diagnosis present

## 2016-02-16 DIAGNOSIS — M810 Age-related osteoporosis without current pathological fracture: Secondary | ICD-10-CM | POA: Diagnosis present

## 2016-02-16 DIAGNOSIS — Z09 Encounter for follow-up examination after completed treatment for conditions other than malignant neoplasm: Secondary | ICD-10-CM

## 2016-02-16 DIAGNOSIS — Z90710 Acquired absence of both cervix and uterus: Secondary | ICD-10-CM

## 2016-02-16 DIAGNOSIS — F03918 Unspecified dementia, unspecified severity, with other behavioral disturbance: Secondary | ICD-10-CM | POA: Diagnosis present

## 2016-02-16 DIAGNOSIS — W19XXXD Unspecified fall, subsequent encounter: Secondary | ICD-10-CM | POA: Diagnosis not present

## 2016-02-16 DIAGNOSIS — M25551 Pain in right hip: Secondary | ICD-10-CM | POA: Diagnosis present

## 2016-02-16 DIAGNOSIS — F0391 Unspecified dementia with behavioral disturbance: Secondary | ICD-10-CM | POA: Diagnosis present

## 2016-02-16 DIAGNOSIS — Z7982 Long term (current) use of aspirin: Secondary | ICD-10-CM

## 2016-02-16 DIAGNOSIS — F419 Anxiety disorder, unspecified: Secondary | ICD-10-CM | POA: Diagnosis present

## 2016-02-16 DIAGNOSIS — Z419 Encounter for procedure for purposes other than remedying health state, unspecified: Secondary | ICD-10-CM

## 2016-02-16 DIAGNOSIS — R1310 Dysphagia, unspecified: Secondary | ICD-10-CM | POA: Diagnosis present

## 2016-02-16 DIAGNOSIS — R0602 Shortness of breath: Secondary | ICD-10-CM

## 2016-02-16 DIAGNOSIS — I10 Essential (primary) hypertension: Secondary | ICD-10-CM | POA: Diagnosis not present

## 2016-02-16 DIAGNOSIS — S72009A Fracture of unspecified part of neck of unspecified femur, initial encounter for closed fracture: Secondary | ICD-10-CM | POA: Diagnosis present

## 2016-02-16 DIAGNOSIS — S72001D Fracture of unspecified part of neck of right femur, subsequent encounter for closed fracture with routine healing: Secondary | ICD-10-CM | POA: Diagnosis not present

## 2016-02-16 LAB — URINALYSIS, ROUTINE W REFLEX MICROSCOPIC
BILIRUBIN URINE: NEGATIVE
GLUCOSE, UA: NEGATIVE mg/dL
Hgb urine dipstick: NEGATIVE
KETONES UR: NEGATIVE mg/dL
LEUKOCYTES UA: NEGATIVE
NITRITE: NEGATIVE
PROTEIN: NEGATIVE mg/dL
Specific Gravity, Urine: 1.012 (ref 1.005–1.030)
pH: 6.5 (ref 5.0–8.0)

## 2016-02-16 LAB — CBC WITH DIFFERENTIAL/PLATELET
BASOS ABS: 0 10*3/uL (ref 0.0–0.1)
BASOS PCT: 0 %
EOS PCT: 0 %
Eosinophils Absolute: 0 10*3/uL (ref 0.0–0.7)
HCT: 39.3 % (ref 36.0–46.0)
Hemoglobin: 13 g/dL (ref 12.0–15.0)
Lymphocytes Relative: 5 %
Lymphs Abs: 0.7 10*3/uL (ref 0.7–4.0)
MCH: 29.9 pg (ref 26.0–34.0)
MCHC: 33.1 g/dL (ref 30.0–36.0)
MCV: 90.3 fL (ref 78.0–100.0)
MONO ABS: 0.5 10*3/uL (ref 0.1–1.0)
MONOS PCT: 4 %
Neutro Abs: 11.1 10*3/uL — ABNORMAL HIGH (ref 1.7–7.7)
Neutrophils Relative %: 91 %
PLATELETS: 247 10*3/uL (ref 150–400)
RBC: 4.35 MIL/uL (ref 3.87–5.11)
RDW: 12.8 % (ref 11.5–15.5)
WBC: 12.4 10*3/uL — ABNORMAL HIGH (ref 4.0–10.5)

## 2016-02-16 LAB — BASIC METABOLIC PANEL
Anion gap: 11 (ref 5–15)
BUN: 15 mg/dL (ref 6–20)
CO2: 27 mmol/L (ref 22–32)
Calcium: 9.3 mg/dL (ref 8.9–10.3)
Chloride: 104 mmol/L (ref 101–111)
Creatinine, Ser: 0.76 mg/dL (ref 0.44–1.00)
GFR calc Af Amer: >60 mL/min (ref >60)
GLUCOSE: 121 mg/dL — AB (ref 65–99)
Potassium: 3.7 mmol/L (ref 3.5–5.1)
Sodium: 142 mmol/L (ref 135–145)

## 2016-02-16 LAB — TROPONIN I
Troponin I: <0.03 ng/mL (ref <0.031)
Troponin I: <0.03 ng/mL (ref <0.031)

## 2016-02-16 LAB — APTT: aPTT: 28 seconds (ref 24–37)

## 2016-02-16 LAB — PROTIME-INR
INR: 1.07 (ref 0.00–1.49)
Prothrombin Time: 14.1 seconds (ref 11.6–15.2)

## 2016-02-16 LAB — TSH: TSH: 1.313 u[IU]/mL (ref 0.350–4.500)

## 2016-02-16 LAB — SURGICAL PCR SCREEN
MRSA, PCR: NEGATIVE
STAPHYLOCOCCUS AUREUS: NEGATIVE

## 2016-02-16 MED ORDER — MORPHINE SULFATE (PF) 2 MG/ML IV SOLN: 0.5000 mg | INTRAVENOUS | Status: DC | PRN

## 2016-02-16 MED ORDER — HYDRALAZINE HCL 20 MG/ML IJ SOLN: 10.0000 mg | Freq: Four times a day (QID) | INTRAMUSCULAR | Status: DC | PRN

## 2016-02-16 MED ORDER — LORAZEPAM 2 MG/ML IJ SOLN: 1.0000 mg | Freq: Four times a day (QID) | INTRAMUSCULAR | Status: DC | PRN

## 2016-02-16 MED ORDER — TRIHEXYPHENIDYL HCL 2 MG PO TABS
2.0000 mg | ORAL_TABLET | Freq: Two times a day (BID) | ORAL | Status: DC
  Administered 2016-02-17 – 2016-02-19 (×4): 2 mg via ORAL
  Filled 2016-02-16 (×7): qty 1

## 2016-02-16 MED ORDER — RALOXIFENE HCL 60 MG PO TABS
60.0000 mg | ORAL_TABLET | Freq: Every day | ORAL | Status: DC
  Administered 2016-02-18 – 2016-02-19 (×2): 60 mg via ORAL
  Filled 2016-02-16 (×7): qty 1

## 2016-02-16 MED ORDER — ENOXAPARIN SODIUM 40 MG/0.4ML ~~LOC~~ SOLN: 40.0000 mg | SUBCUTANEOUS | Status: DC

## 2016-02-16 MED ORDER — DONEPEZIL HCL 10 MG PO TABS
10.0000 mg | ORAL_TABLET | Freq: Every day | ORAL | Status: DC
  Administered 2016-02-16 – 2016-02-18 (×3): 10 mg via ORAL
  Filled 2016-02-16 (×3): qty 1

## 2016-02-16 MED ORDER — SODIUM CHLORIDE 0.9 % IV SOLN
INTRAVENOUS | Status: DC
  Administered 2016-02-16: 21:00:00 via INTRAVENOUS

## 2016-02-16 MED ORDER — QUETIAPINE FUMARATE ER 50 MG PO TB24
50.0000 mg | ORAL_TABLET | Freq: Every day | ORAL | Status: DC
  Administered 2016-02-16 – 2016-02-18 (×3): 50 mg via ORAL
  Filled 2016-02-16 (×4): qty 1

## 2016-02-16 MED ORDER — HYDROCODONE-ACETAMINOPHEN 5-325 MG PO TABS
1.0000 | ORAL_TABLET | Freq: Four times a day (QID) | ORAL | Status: DC | PRN
  Filled 2016-02-16: qty 2

## 2016-02-16 MED ORDER — MEMANTINE HCL ER 28 MG PO CP24
28.0000 mg | ORAL_CAPSULE | Freq: Every day | ORAL | Status: DC
  Administered 2016-02-18 – 2016-02-19 (×2): 28 mg via ORAL
  Filled 2016-02-16 (×7): qty 1

## 2016-02-16 MED ORDER — DOCUSATE SODIUM 100 MG PO CAPS
100.0000 mg | ORAL_CAPSULE | Freq: Two times a day (BID) | ORAL | Status: DC
  Administered 2016-02-16: 100 mg via ORAL
  Filled 2016-02-16: qty 1

## 2016-02-16 NOTE — H&P (Signed)
[facility] History and Physical  [name] BJY:[id number] DOB: 22-May-1932 DOA: 02/16/2016  Referring physician: Madilyn Hook PCP: [name], MD   Chief Complaint: fall  HPI: [name] is a 82 y.o. female with a past medical history that includes advancing dementia, GERD, gait abnormality, stroke 1985 with no deficits, as as to the emergency department with the chief complaint of fall. Initial evaluation in the emergency department reveals right femoral neck fracture as well as hypertension.  Information is obtained from the husband is at the bedside. He states he took a sleep aid last night did not hear the patient get up but he awakened his morning and found her on the floor. Husband speculates that she was coming back from the bathroom given that she had fallen towards the bed. She was awake and alert. Unknown how long she was on the floor. She did not call out. He essentially to get up she did complain of right hip pain. He reports she's been in her usual state of health. No complaints of chest pain palpitation shortness of breath cough fever chills recent sick contacts. No abdominal pain nausea vomiting diarrhea. No complaints of dysuria hematuria frequency or urgency. Of note patient was in the emergency department last week with behavioral disturbances. Her cup was negative was discharged home.  The emergency department she is afebrile hemodynamically stable with a blood pressure somewhat elevated. She is not hypoxic.   Review of Systems:  10 point review of systems complete and all systems are negative except as indicated in the history of present illness  Past Medical History  Diagnosis Date  . Anxiety   . GERD (gastroesophageal reflux disease)   . Insomnia   . Memory loss   . Lumbago   . dementia   . Stroke [facility]) 1985    no deficits   Past Surgical History  Procedure Laterality Date  . Tonsillectomy    . Adenoidectomy    . Cholecystectomy    . Total vaginal  hysterectomy    . Appendectomy    . Abdominal hysterectomy    . Hip arthroplasty Left 02/07/2014    Procedure: ARTHROPLASTY BIPOLAR HIP;  Surgeon: Shelda Pal, MD;  Location: [facility] OR;  Service: Orthopedics;  Laterality: Left;   Social History:  reports that she has never smoked. She has never used smokeless tobacco. She reports that she does not drink alcohol or use illicit drugs. Patient lives at home with her husband she uses a cane with ambulation as result of hip surgery on the left side 2 years ago. She has advanced dementia is total assist with ADLs. Tends to wander No Known Allergies  Family History  Problem Relation Age of Onset  . Cancer Mother 68  . Cancer Father 50  . Cancer Father      Prior to Admission medications   Medication Sig Start Date End Date Taking? Authorizing Provider  aspirin 81 MG tablet Take 81 mg by mouth daily.   Yes Historical Provider, MD  Cyanocobalamin (VITAMIN B 12 PO) Take 1 tablet by mouth daily.    Yes Historical Provider, MD  donepezil (ARICEPT) 10 MG tablet Take 1 tablet (10 mg total) by mouth at bedtime. 10/04/15  Yes Nilda Riggs, NP  fluPHENAZine decanoate (PROLIXIN) 25 MG/ML injection Inject 25 mg into the muscle every 21 ( twenty-one) days.    Yes Historical Provider, MD  memantine (NAMENDA XR) 28 MG CP24 24 hr capsule Take 1 capsule (28 mg total) by mouth  at bedtime. 10/04/15  Yes Nilda Riggs, NP  QUEtiapine (SEROQUEL) 50 MG tablet Take 50 mg by mouth at bedtime.   Yes Historical Provider, MD  raloxifene (EVISTA) 60 MG tablet Take 60 mg by mouth daily.  09/26/14  Yes Historical Provider, MD  trihexyphenidyl (ARTANE) 2 MG tablet Take 2 mg by mouth 2 (two) times daily. 01/31/16  Yes Historical Provider, MD  Vitamin D, Cholecalciferol, 1000 UNITS TABS Take 2,000 Units by mouth daily.   Yes Historical Provider, MD  aspirin EC 325 MG EC tablet Take 1 tablet (325 mg total) by mouth 2 (two) times daily. Patient not taking: Reported  on 02/16/2016 02/11/14   Marden Noble, MD   Physical Exam: Filed Vitals:   02/16/16 0820 02/16/16 1015 02/16/16 1030  BP: 165/97 158/116 184/103  Pulse: 87 94 84  Temp: 98.2 F (36.8 C)    TempSrc: Oral    Resp: 17    SpO2: 97% 98% 98%    Wt Readings from Last 3 Encounters:  10/04/15 77.747 kg (171 lb 6.4 oz)  04/03/15 78.382 kg (172 lb 12.8 oz)  10/12/14 80.287 kg (177 lb)    General:  Appears calm and comfortable Eyes: PERRL, normal lids, irises & conjunctiva ENT: grossly normal hearing, lips & tongue His membranes of her mouth are slightly dry but pink Neck: no LAD, masses or thyromegaly Cardiovascular: RRR, no m/r/g. No LE edema.  Respiratory: CTA bilaterally, no w/r/r. Normal respiratory effort. Abdomen: soft, ntnd nondistended positive bowel sounds Skin: no rash or induration seen on limited exam, small laceration right side of head just above temporal  Musculoskeletal: grossly normal tone BUE/BLE, right hip with no erythema or swelling. Appears nontender to palpation. Right lower extremity externally rotated somewhat shortened. Pedal pulses present and palpable Psychiatric: grossly normal mood and affect, speech fluent and appropriate Neurologic: Alert oriented to name only. Will follow simple commands with frequent prompting. Speech clear facial symmetry           Labs on Admission:  Basic Metabolic Panel:  Recent Labs Lab 02/09/16 1231 02/16/16 0859  NA 142 142  K 3.7 3.7  CL 107 104  CO2 26 27  GLUCOSE 87 121*  BUN 9 15  CREATININE 0.68 0.76  CALCIUM 8.8* 9.3   Liver Function Tests: No results for input(s): AST, ALT, ALKPHOS, BILITOT, PROT, ALBUMIN in the last 168 hours. No results for input(s): LIPASE, AMYLASE in the last 168 hours. No results for input(s): AMMONIA in the last 168 hours. CBC:  Recent Labs Lab 02/09/16 1231 02/16/16 0859  WBC 5.8 12.4*  NEUTROABS 3.5 11.1*  HGB 11.9* 13.0  HCT 37.7 39.3  MCV 90.8 90.3  PLT 267 247   Cardiac  Enzymes: No results for input(s): CKTOTAL, CKMB, CKMBINDEX, TROPONINI in the last 168 hours.  BNP (last 3 results) No results for input(s): BNP in the last 8760 hours.  ProBNP (last 3 results) No results for input(s): PROBNP in the last 8760 hours.  CBG:  Recent Labs Lab 02/09/16 1401  GLUCAP 73    Radiological Exams on Admission: Dg Chest 1 View  02/16/2016  CLINICAL DATA:  Fall today, right hip pain EXAM: CHEST 1 VIEW COMPARISON:  02/06/2014 FINDINGS: Cardiomediastinal silhouette is stable. Mild elevation of the right hemidiaphragm. No infiltrate or pulmonary edema. Atherosclerotic calcifications of thoracic aorta. There is no pneumothorax. IMPRESSION: No infiltrate or pulmonary edema. Mild elevation of the right hemidiaphragm again noted. No pneumothorax. Electronically Signed   By: Lang Snow  Pop M.D.   On: 02/16/2016 09:23   Ct Head Wo Contrast  02/16/2016  CLINICAL DATA:  Status post fall earlier today EXAM: CT HEAD WITHOUT CONTRAST CT CERVICAL SPINE WITHOUT CONTRAST TECHNIQUE: Multidetector CT imaging of the head and cervical spine was performed following the standard protocol without intravenous contrast. Multiplanar CT image reconstructions of the cervical spine were also generated. COMPARISON:  Head CT February 09, 2016 FINDINGS: CT HEAD FINDINGS Moderate diffuse atrophy is stable. There is no intracranial mass, hemorrhage, extra-axial fluid collection, or midline shift. Patchy small vessel disease in the centra semiovale bilaterally is stable. There is no new gray-white compartment lesion. No acute infarct evident. Basal ganglia calcification is noted bilaterally. Bony calvarium appears intact. The mastoid air cells are clear. No intraorbital lesions are apparent. CT CERVICAL SPINE FINDINGS There is no fracture or spondylolisthesis. Prevertebral soft tissues and predental space regions are normal. There is moderate disc space narrowing at C4-5, C5-6, and C6-7. There is facet hypertrophy at  multiple levels bilaterally. There is exit foraminal narrowing at multiple levels, most notably at C3-4 on the left, C4-5 on the right, C5-6 on the right, and C6-7 bilaterally. IMPRESSION: CT head: Moderate generalized atrophy with patchy periventricular small vessel disease. No acute infarct evident. No hemorrhage, mass, or extra-axial fluid collection. CT cervical spine: Multilevel arthropathy. No demonstrable fracture or spondylolisthesis. Electronically Signed   By: Bretta BangWilliam  Woodruff III M.D.   On: 02/16/2016 09:42   Ct Cervical Spine Wo Contrast  02/16/2016  CLINICAL DATA:  Status post fall earlier today EXAM: CT HEAD WITHOUT CONTRAST CT CERVICAL SPINE WITHOUT CONTRAST TECHNIQUE: Multidetector CT imaging of the head and cervical spine was performed following the standard protocol without intravenous contrast. Multiplanar CT image reconstructions of the cervical spine were also generated. COMPARISON:  Head CT February 09, 2016 FINDINGS: CT HEAD FINDINGS Moderate diffuse atrophy is stable. There is no intracranial mass, hemorrhage, extra-axial fluid collection, or midline shift. Patchy small vessel disease in the centra semiovale bilaterally is stable. There is no new gray-white compartment lesion. No acute infarct evident. Basal ganglia calcification is noted bilaterally. Bony calvarium appears intact. The mastoid air cells are clear. No intraorbital lesions are apparent. CT CERVICAL SPINE FINDINGS There is no fracture or spondylolisthesis. Prevertebral soft tissues and predental space regions are normal. There is moderate disc space narrowing at C4-5, C5-6, and C6-7. There is facet hypertrophy at multiple levels bilaterally. There is exit foraminal narrowing at multiple levels, most notably at C3-4 on the left, C4-5 on the right, C5-6 on the right, and C6-7 bilaterally. IMPRESSION: CT head: Moderate generalized atrophy with patchy periventricular small vessel disease. No acute infarct evident. No hemorrhage,  mass, or extra-axial fluid collection. CT cervical spine: Multilevel arthropathy. No demonstrable fracture or spondylolisthesis. Electronically Signed   By: Bretta BangWilliam  Woodruff III M.D.   On: 02/16/2016 09:42   Dg Hip Unilat With Pelvis 2-3 Views Right  02/16/2016  CLINICAL DATA:  Fall today, right hip pain EXAM: DG HIP (WITH OR WITHOUT PELVIS) 2-3V RIGHT COMPARISON:  None. FINDINGS: Three views of the right hip submitted. There is mild displaced fracture of the right femoral neck. Diffuse osteopenia. Left hip prosthesis partially visualized. There is anatomic alignment of left hip prosthesis. IMPRESSION: Mild displaced fracture of the right femoral neck. Diffuse osteopenia. Partially visualized left hip prosthesis with anatomic alignment. Electronically Signed   By: Natasha MeadLiviu  Pop M.D.   On: 02/16/2016 09:22    EKG: Pending on admission  Assessment/Plan Principal Problem:  Femoral neck fracture (HCC) Active Problems:   GERD (gastroesophageal reflux disease)   Dementia with behavioral disturbance   HTN (hypertension)   Fall  #1. Femoral neck fracture on the right per x-ray. -Admit to telemetry -NPO until evaluated by ortho -pain control -ortho consult  2. Hypertension. No history of hypertension. Likely related to pain. Systolic blood pressure 184/103 in the emergency department. -PRN hydralazine -monitor closey -pain control  #3 fall. Unwitnessed. Likely mechanical given hx unsteady gait/tremors and advancing dementia and lack of other comorbities.  -monitor on tele -cycle tropoinin -TSH  #4. Dementia with painful disturbances. Family reports progression of disease. Home medications include Aricept, Namenda, Seraquel, artane. -monitor  -continue home meds  [facility] noted  Code Status: dnr DVT Prophylaxis: Family Communication: daughter and son at bedside Disposition Plan: likely snf  Time spent: 43 minutes  [facility] M [facility]

## 2016-02-16 NOTE — ED Notes (Signed)
Pt arrives EMS from home after unwitnessed fall. Lac about 1 inch at right forehead. Pt originally c/o pain at right hip but currently denies and denies pain on palpation to either hip. Pt has hx of dementia.Husband at bedside.

## 2016-02-16 NOTE — Consult Note (Signed)
GATES,ROBERT NEVILL, MD Chief Complaint: Right hip fracture History: HPI: [name] is a 82 y.o. female with a past medical history that includes advancing dementia, GERD, gait abnormality, stroke 1985 with no deficits, as as to the emergency department with the chief complaint of fall. Initial evaluation in the emergency department reveals right femoral neck fracture as well as hypertension. Past Medical History  Diagnosis Date  . Anxiety   . GERD (gastroesophageal reflux disease)   . Insomnia   . Memory loss   . Lumbago   . dementia   . Stroke (HCC) 1985    no deficits    No Known Allergies  No current facility-administered medications on file prior to encounter.   Current Outpatient Prescriptions on File Prior to Encounter  Medication Sig Dispense Refill  . Cyanocobalamin (VITAMIN B 12 PO) Take 1 tablet by mouth daily.     . donepezil (ARICEPT) 10 MG tablet Take 1 tablet (10 mg total) by mouth at bedtime. 30 tablet 7  . fluPHENAZine decanoate (PROLIXIN) 25 MG/ML injection Inject 25 mg into the muscle every 21 ( twenty-one) days.     . memantine (NAMENDA XR) 28 MG CP24 24 hr capsule Take 1 capsule (28 mg total) by mouth at bedtime. 30 capsule 7  . QUEtiapine (SEROQUEL) 50 MG tablet Take 50 mg by mouth at bedtime.    . raloxifene (EVISTA) 60 MG tablet Take 60 mg by mouth daily.     . Vitamin D, Cholecalciferol, 1000 UNITS TABS Take 2,000 Units by mouth daily.    . aspirin EC 325 MG EC tablet Take 1 tablet (325 mg total) by mouth 2 (two) times daily. (Patient not taking: Reported on 02/16/2016) 30 tablet 0    Physical Exam: Filed Vitals:   02/16/16 1015 02/16/16 1030  BP: 158/116 184/103  Pulse: 94 84  Temp:    Resp:     A+OX3 NVI  Compartments soft/nt No SOB/CP Abd soft/NT Right hip pain with palpation Intact 1+ DP/PT   Image: Dg Chest 1 View  02/16/2016  CLINICAL DATA:  Fall today, right hip pain EXAM: CHEST 1 VIEW COMPARISON:  02/06/2014 FINDINGS:  Cardiomediastinal silhouette is stable. Mild elevation of the right hemidiaphragm. No infiltrate or pulmonary edema. Atherosclerotic calcifications of thoracic aorta. There is no pneumothorax. IMPRESSION: No infiltrate or pulmonary edema. Mild elevation of the right hemidiaphragm again noted. No pneumothorax. Electronically Signed   By: Liviu  Pop M.D.   On: 02/16/2016 09:23   Ct Head Wo Contrast  02/16/2016  CLINICAL DATA:  Status post fall earlier today EXAM: CT HEAD WITHOUT CONTRAST CT CERVICAL SPINE WITHOUT CONTRAST TECHNIQUE: Multidetector CT imaging of the head and cervical spine was performed following the standard protocol without intravenous contrast. Multiplanar CT image reconstructions of the cervical spine were also generated. COMPARISON:  Head CT February 09, 2016 FINDINGS: CT HEAD FINDINGS Moderate diffuse atrophy is stable. There is no intracranial mass, hemorrhage, extra-axial fluid collection, or midline shift. Patchy small vessel disease in the centra semiovale bilaterally is stable. There is no new gray-white compartment lesion. No acute infarct evident. Basal ganglia calcification is noted bilaterally. Bony calvarium appears intact. The mastoid air cells are clear. No intraorbital lesions are apparent. CT CERVICAL SPINE FINDINGS There is no fracture or spondylolisthesis. Prevertebral soft tissues and predental space regions are normal. There is moderate disc space narrowing at C4-5, C5-6, and C6-7. There is facet hypertrophy at multiple levels bilaterally. There is exit foraminal narrowing at multiple levels, most notably   at C3-4 on the left, C4-5 on the right, C5-6 on the right, and C6-7 bilaterally. IMPRESSION: CT head: Moderate generalized atrophy with patchy periventricular small vessel disease. No acute infarct evident. No hemorrhage, mass, or extra-axial fluid collection. CT cervical spine: Multilevel arthropathy. No demonstrable fracture or spondylolisthesis. Electronically Signed   By:  William  Woodruff III M.D.   On: 02/16/2016 09:42   Ct Head Wo Contrast  02/09/2016  CLINICAL DATA:  Confusion.  History of dementia EXAM: CT HEAD WITHOUT CONTRAST TECHNIQUE: Contiguous axial images were obtained from the base of the skull through the vertex without intravenous contrast. COMPARISON:  January 17, 2011 FINDINGS: Moderate diffuse atrophy is stable. There is no intracranial mass, hemorrhage, extra-axial fluid collection, or midline shift. There is patchy small vessel disease in the centra semiovale bilaterally. Elsewhere, gray-white compartments appear normal. No acute infarct is evident. There are basal ganglia calcifications bilaterally, a stable finding. The bony calvarium appears intact. The mastoid air cells are clear. No intraorbital lesions are identified. IMPRESSION: Moderate atrophy with patchy small vessel disease in the centra semiovale bilaterally. No intracranial mass, hemorrhage, or acute appearing infarct. Basal ganglia calcification is likely physiologic in this age group, stable. Electronically Signed   By: William  Woodruff III M.D.   On: 02/09/2016 13:36   Ct Cervical Spine Wo Contrast  02/16/2016  CLINICAL DATA:  Status post fall earlier today EXAM: CT HEAD WITHOUT CONTRAST CT CERVICAL SPINE WITHOUT CONTRAST TECHNIQUE: Multidetector CT imaging of the head and cervical spine was performed following the standard protocol without intravenous contrast. Multiplanar CT image reconstructions of the cervical spine were also generated. COMPARISON:  Head CT February 09, 2016 FINDINGS: CT HEAD FINDINGS Moderate diffuse atrophy is stable. There is no intracranial mass, hemorrhage, extra-axial fluid collection, or midline shift. Patchy small vessel disease in the centra semiovale bilaterally is stable. There is no new gray-white compartment lesion. No acute infarct evident. Basal ganglia calcification is noted bilaterally. Bony calvarium appears intact. The mastoid air cells are clear. No  intraorbital lesions are apparent. CT CERVICAL SPINE FINDINGS There is no fracture or spondylolisthesis. Prevertebral soft tissues and predental space regions are normal. There is moderate disc space narrowing at C4-5, C5-6, and C6-7. There is facet hypertrophy at multiple levels bilaterally. There is exit foraminal narrowing at multiple levels, most notably at C3-4 on the left, C4-5 on the right, C5-6 on the right, and C6-7 bilaterally. IMPRESSION: CT head: Moderate generalized atrophy with patchy periventricular small vessel disease. No acute infarct evident. No hemorrhage, mass, or extra-axial fluid collection. CT cervical spine: Multilevel arthropathy. No demonstrable fracture or spondylolisthesis. Electronically Signed   By: William  Woodruff III M.D.   On: 02/16/2016 09:42   Dg Hip Unilat With Pelvis 2-3 Views Right  02/16/2016  CLINICAL DATA:  Fall today, right hip pain EXAM: DG HIP (WITH OR WITHOUT PELVIS) 2-3V RIGHT COMPARISON:  None. FINDINGS: Three views of the right hip submitted. There is mild displaced fracture of the right femoral neck. Diffuse osteopenia. Left hip prosthesis partially visualized. There is anatomic alignment of left hip prosthesis. IMPRESSION: Mild displaced fracture of the right femoral neck. Diffuse osteopenia. Partially visualized left hip prosthesis with anatomic alignment. Electronically Signed   By: Liviu  Pop M.D.   On: 02/16/2016 09:22    A/P:  Patient s/p fall with inability to ambulate and right hip pain Agree with radiologist about hip fracture Family wishes someone from my practice to address fx ASAP Dr Swimteck is available on Sunday.    Per their request I will see if someone is available on Saturday Admit to medical team Teds/SCD's for DVT prevention Minimal narcotics for pain control given baseline dementia    

## 2016-02-16 NOTE — ED Provider Notes (Signed)
CSN: [id number]     Arrival date & time 02/16/16  0807 History   First MD Initiated Contact with Patient 02/16/16 [phone]     Chief Complaint  Patient presents with  . Fall     Patient is a 82 y.o. female presenting with fall. The history is provided by the spouse. No language interpreter was used.  Fall   [name] is a 82 y.o. female who presents to the Emergency Department complaining of fall. Level V caveat due to dementia.  History is provided by the patient's husband. He states that she was up talking for a lot of the night and had difficulty sleeping so he took a Klonopin about 1 in the morning. He awoke this morning he found her on the floor next to the bed. He states that she complained of right hip pain. She had an unwitnessed fall. No recent illness. No fevers, vomiting, diarrhea. Symptoms are moderate and constant nature.  Past Medical History  Diagnosis Date  . Anxiety   . GERD (gastroesophageal reflux disease)   . Insomnia   . Memory loss   . Lumbago   . dementia   . Stroke [facility]) 1985    no deficits   Past Surgical History  Procedure Laterality Date  . Tonsillectomy    . Adenoidectomy    . Cholecystectomy    . Total vaginal hysterectomy    . Appendectomy    . Abdominal hysterectomy    . Hip arthroplasty Left 02/07/2014    Procedure: ARTHROPLASTY BIPOLAR HIP;  Surgeon: Shelda Pal, MD;  Location: [facility] OR;  Service: Orthopedics;  Laterality: Left;   Family History  Problem Relation Age of Onset  . Cancer Mother 32  . Cancer Father 48  . Cancer Father    Social History  Substance Use Topics  . Smoking status: Never Smoker   . Smokeless tobacco: Never Used  . Alcohol Use: No   OB History    No data available     Review of Systems  All other systems reviewed and are negative.     Allergies  Review of patient's allergies indicates no known allergies.  Home Medications   Prior to Admission medications   Medication Sig Start Date End Date Taking?  Authorizing Provider  AFLURIA SUSP  09/16/14   Historical Provider, MD  aspirin EC 325 MG EC tablet Take 1 tablet (325 mg total) by mouth 2 (two) times daily. 02/11/14   Marden Noble, MD  BOOSTRIX 5-2.5-18.5 injection  08/11/14   Historical Provider, MD  Cyanocobalamin (VITAMIN B 12 PO) Take 1 tablet by mouth daily.     Historical Provider, MD  divalproex (DEPAKOTE) 125 MG DR tablet Take 125 mg by mouth 2 (two) times daily.  08/25/15   Historical Provider, MD  donepezil (ARICEPT) 10 MG tablet Take 1 tablet (10 mg total) by mouth at bedtime. Patient not taking: Reported on 02/09/2016 10/04/15   Nilda Riggs, NP  fluPHENAZine decanoate (PROLIXIN) 25 MG/ML injection Inject 25 mg into the muscle every 14 (fourteen) days.     Historical Provider, MD  LORazepam (ATIVAN) 1 MG tablet Take 1 mg by mouth 2 (two) times daily.  09/12/14   Historical Provider, MD  memantine (NAMENDA XR) 28 MG CP24 24 hr capsule Take 1 capsule (28 mg total) by mouth at bedtime. 10/04/15   Nilda Riggs, NP  pantoprazole (PROTONIX) 40 MG tablet Take 40 mg by mouth daily.  10/07/13   Historical Provider, MD  primidone (MYSOLINE) 50 MG tablet Take 50 mg by mouth 2 (two) times daily.  09/23/15   Historical Provider, MD  QUEtiapine (SEROQUEL) 50 MG tablet Take 50 mg by mouth at bedtime.    Historical Provider, MD  raloxifene (EVISTA) 60 MG tablet Take 60 mg by mouth daily.  09/26/14   Historical Provider, MD  Vitamin D, Cholecalciferol, 1000 UNITS TABS Take 2,000 Units by mouth daily.    Historical Provider, MD   BP 165/97 mmHg  Pulse 87  Temp(Src) 98.2 F (36.8 C) (Oral)  Resp 17  SpO2 97% Physical Exam  Constitutional: She appears well-developed and well-nourished.  HENT:  Head: Normocephalic.  2 cm laceration to the right temple  Neck:  No C-spine tenderness  Cardiovascular: Normal rate and regular rhythm.   No murmur heard. Pulmonary/Chest: Effort normal and breath sounds normal. No respiratory distress.   Abdominal: Soft. There is no tenderness. There is no rebound and no guarding.  Musculoskeletal: She exhibits no edema.  Tenderness over the right hip with decreased range of motion. 2+ DP pulses bilaterally. Right lower extremity is externally rotated and shortened  Neurological: She is alert.  Disoriented to place and time  Skin: Skin is warm and dry.  Psychiatric: She has a normal mood and affect. Her behavior is normal.  Nursing note and vitals reviewed.   ED Course  Procedures (including critical care time)  LACERATION REPAIR Performed by: Tilden FossaElizabeth Peighton Edgin Authorized by: Tilden FossaElizabeth Cayne Yom Consent: Verbal consent obtained. Risks and benefits: risks, benefits and alternatives were discussed Consent given by: patient Patient identity confirmed: provided demographic data Wound explored  Laceration Location: right temple  Laceration Length: 2cm  No Foreign Bodies seen or palpated  Skin cleansed with chlorhexidine and saline Amount of cleaning: standard  Skin closure: tissue adhesive  Patient tolerance: Patient tolerated the procedure well with no immediate complications.   Labs Review Labs Reviewed - No data to display  Imaging Review No results found. I have personally reviewed and evaluated these images and lab results as part of my medical decision-making.   EKG Interpretation None      MDM   Final diagnoses:  None    Patient here for evaluation following a fall that was unwitnessed at home. She has a right temporal laceration that was repaired per procedure note. She also has a right femoral neck fracture. Patient appears to be asymptomatic in the emergency department unless she tries to move. She has had a prior left hip replacement by Dr. Charlann Boxerlin. [facility] and medicine consulted for admission.    Tilden FossaElizabeth Camari Wisham, MD 02/16/16 [phone]

## 2016-02-16 NOTE — ED Notes (Signed)
Attempted report 

## 2016-02-17 ENCOUNTER — Inpatient Hospital Stay (HOSPITAL_COMMUNITY): Payer: Medicare Other

## 2016-02-17 ENCOUNTER — Encounter (HOSPITAL_COMMUNITY)
Admission: EM | Disposition: A | Discharge status: Skilled Nursing Facility | Payer: Self-pay | Source: Home / Self Care | Attending: Internal Medicine

## 2016-02-17 ENCOUNTER — Inpatient Hospital Stay (HOSPITAL_COMMUNITY): Payer: Medicare Other | Admitting: Anesthesiology

## 2016-02-17 DIAGNOSIS — S72001A Fracture of unspecified part of neck of right femur, initial encounter for closed fracture: Secondary | ICD-10-CM | POA: Diagnosis present

## 2016-02-17 DIAGNOSIS — S72001D Fracture of unspecified part of neck of right femur, subsequent encounter for closed fracture with routine healing: Secondary | ICD-10-CM

## 2016-02-17 DIAGNOSIS — W19XXXD Unspecified fall, subsequent encounter: Secondary | ICD-10-CM

## 2016-02-17 DIAGNOSIS — I10 Essential (primary) hypertension: Secondary | ICD-10-CM

## 2016-02-17 DIAGNOSIS — F0391 Unspecified dementia with behavioral disturbance: Secondary | ICD-10-CM

## 2016-02-17 HISTORY — PX: ANTERIOR APPROACH HEMI HIP ARTHROPLASTY: SHX6690

## 2016-02-17 LAB — BASIC METABOLIC PANEL
Anion gap: 11 (ref 5–15)
BUN: 17 mg/dL (ref 6–20)
CALCIUM: 8.9 mg/dL (ref 8.9–10.3)
CO2: 23 mmol/L (ref 22–32)
Chloride: 107 mmol/L (ref 101–111)
Creatinine, Ser: 0.8 mg/dL (ref 0.44–1.00)
GFR calc Af Amer: >60 mL/min (ref >60)
GLUCOSE: 119 mg/dL — AB (ref 65–99)
POTASSIUM: 3.7 mmol/L (ref 3.5–5.1)
SODIUM: 141 mmol/L (ref 135–145)

## 2016-02-17 LAB — CBC
HCT: 37.7 % (ref 36.0–46.0)
Hemoglobin: 12.5 g/dL (ref 12.0–15.0)
MCH: 29.8 pg (ref 26.0–34.0)
MCHC: 33.2 g/dL (ref 30.0–36.0)
MCV: 90 fL (ref 78.0–100.0)
PLATELETS: 228 10*3/uL (ref 150–400)
RBC: 4.19 MIL/uL (ref 3.87–5.11)
RDW: 13.1 % (ref 11.5–15.5)
WBC: 7.7 10*3/uL (ref 4.0–10.5)

## 2016-02-17 LAB — T4, FREE: FREE T4: 0.97 ng/dL (ref 0.61–1.12)

## 2016-02-17 LAB — TYPE AND SCREEN
ABO/RH(D): O POS
Antibody Screen: NEGATIVE

## 2016-02-17 LAB — TSH: TSH: 0.745 u[IU]/mL (ref 0.350–4.500)

## 2016-02-17 SURGERY — HEMIARTHROPLASTY, HIP, DIRECT ANTERIOR APPROACH, FOR FRACTURE
Anesthesia: General | Laterality: Right

## 2016-02-17 MED ORDER — FENTANYL CITRATE (PF) 100 MCG/2ML IJ SOLN
INTRAMUSCULAR | Status: DC | PRN
  Administered 2016-02-17: 75 ug via INTRAVENOUS
  Administered 2016-02-17: 50 ug via INTRAVENOUS
  Administered 2016-02-17: 25 ug via INTRAVENOUS

## 2016-02-17 MED ORDER — ASPIRIN EC 325 MG PO TBEC
325.0000 mg | DELAYED_RELEASE_TABLET | Freq: Two times a day (BID) | ORAL | Status: DC
  Filled 2016-02-17: qty 1

## 2016-02-17 MED ORDER — HYDRALAZINE HCL 20 MG/ML IJ SOLN: 10.0000 mg | Freq: Four times a day (QID) | INTRAMUSCULAR | Status: DC | PRN

## 2016-02-17 MED ORDER — CEFAZOLIN SODIUM-DEXTROSE 2-3 GM-% IV SOLR
2.0000 g | Freq: Four times a day (QID) | INTRAVENOUS | Status: AC
  Administered 2016-02-17 – 2016-02-18 (×2): 2 g via INTRAVENOUS
  Filled 2016-02-17 (×2): qty 50

## 2016-02-17 MED ORDER — ESMOLOL HCL 100 MG/10ML IV SOLN
INTRAVENOUS | Status: AC
  Filled 2016-02-17: qty 10

## 2016-02-17 MED ORDER — FENTANYL CITRATE (PF) 100 MCG/2ML IJ SOLN: 25.0000 ug | INTRAMUSCULAR | Status: DC | PRN

## 2016-02-17 MED ORDER — CEFAZOLIN SODIUM-DEXTROSE 2-3 GM-% IV SOLR
2.0000 g | Freq: Four times a day (QID) | INTRAVENOUS | Status: DC
  Filled 2016-02-17 (×3): qty 50

## 2016-02-17 MED ORDER — AMLODIPINE BESYLATE 10 MG PO TABS
10.0000 mg | ORAL_TABLET | Freq: Every day | ORAL | Status: DC
  Filled 2016-02-17: qty 1

## 2016-02-17 MED ORDER — HYDROCODONE-ACETAMINOPHEN 5-325 MG PO TABS: 1.0000 | ORAL_TABLET | Freq: Four times a day (QID) | ORAL | Status: DC | PRN

## 2016-02-17 MED ORDER — SODIUM CHLORIDE 0.9 % IV SOLN
INTRAVENOUS | Status: DC
  Administered 2016-02-17: 13:00:00 via INTRAVENOUS

## 2016-02-17 MED ORDER — ESMOLOL HCL 100 MG/10ML IV SOLN
INTRAVENOUS | Status: DC | PRN
  Administered 2016-02-17: 20 mg via INTRAVENOUS

## 2016-02-17 MED ORDER — MENTHOL 3 MG MT LOZG: 1.0000 | LOZENGE | OROMUCOSAL | Status: DC | PRN

## 2016-02-17 MED ORDER — PHENYLEPHRINE HCL 10 MG/ML IJ SOLN
INTRAMUSCULAR | Status: DC | PRN
  Administered 2016-02-17: 80 mg via INTRAVENOUS

## 2016-02-17 MED ORDER — ONDANSETRON HCL 4 MG PO TABS: 4.0000 mg | ORAL_TABLET | Freq: Four times a day (QID) | ORAL | Status: DC | PRN

## 2016-02-17 MED ORDER — LIDOCAINE HCL (CARDIAC) 20 MG/ML IV SOLN
INTRAVENOUS | Status: AC
Start: 2016-02-17 — End: 2016-02-17
  Filled 2016-02-17: qty 5

## 2016-02-17 MED ORDER — SODIUM CHLORIDE 0.9 % IR SOLN
Status: DC | PRN
  Administered 2016-02-17: 1000 mL

## 2016-02-17 MED ORDER — PROPOFOL 10 MG/ML IV BOLUS
INTRAVENOUS | Status: AC
  Filled 2016-02-17: qty 20

## 2016-02-17 MED ORDER — PHENOL 1.4 % MT LIQD: 1.0000 | OROMUCOSAL | Status: DC | PRN

## 2016-02-17 MED ORDER — EPHEDRINE SULFATE 50 MG/ML IJ SOLN
INTRAMUSCULAR | Status: AC
Start: 2016-02-17 — End: 2016-02-17
  Filled 2016-02-17: qty 1

## 2016-02-17 MED ORDER — TRANEXAMIC ACID 1000 MG/10ML IV SOLN
1000.0000 mg | INTRAVENOUS | Status: AC
  Administered 2016-02-17: 1000 mg via INTRAVENOUS
  Filled 2016-02-17: qty 10

## 2016-02-17 MED ORDER — PHENYLEPHRINE HCL 10 MG/ML IJ SOLN
10.0000 mg | INTRAMUSCULAR | Status: DC | PRN
  Administered 2016-02-17: 20 ug/min via INTRAVENOUS

## 2016-02-17 MED ORDER — ONDANSETRON HCL 4 MG/2ML IJ SOLN: 4.0000 mg | Freq: Four times a day (QID) | INTRAMUSCULAR | Status: DC | PRN

## 2016-02-17 MED ORDER — METOPROLOL TARTRATE 1 MG/ML IV SOLN
5.0000 mg | INTRAVENOUS | Status: DC | PRN
  Filled 2016-02-17: qty 5

## 2016-02-17 MED ORDER — PROMETHAZINE HCL 25 MG/ML IJ SOLN: 6.2500 mg | INTRAMUSCULAR | Status: DC | PRN

## 2016-02-17 MED ORDER — ENSURE ENLIVE PO LIQD
237.0000 mL | Freq: Two times a day (BID) | ORAL | Status: DC
  Administered 2016-02-18 – 2016-02-19 (×4): 237 mL via ORAL

## 2016-02-17 MED ORDER — NALOXONE HCL 0.4 MG/ML IJ SOLN
0.4000 mg | INTRAMUSCULAR | Status: DC | PRN
  Filled 2016-02-17: qty 1

## 2016-02-17 MED ORDER — PROPOFOL 10 MG/ML IV BOLUS
INTRAVENOUS | Status: DC | PRN
  Administered 2016-02-17: 80 mg via INTRAVENOUS
  Administered 2016-02-17: 30 mg via INTRAVENOUS

## 2016-02-17 MED ORDER — CEFAZOLIN SODIUM-DEXTROSE 2-3 GM-% IV SOLR
2.0000 g | INTRAVENOUS | Status: AC
  Administered 2016-02-17: 2 g via INTRAVENOUS
  Filled 2016-02-17: qty 50

## 2016-02-17 MED ORDER — LIDOCAINE HCL (CARDIAC) 20 MG/ML IV SOLN
INTRAVENOUS | Status: AC
  Filled 2016-02-17: qty 5

## 2016-02-17 MED ORDER — FENTANYL CITRATE (PF) 250 MCG/5ML IJ SOLN
INTRAMUSCULAR | Status: AC
  Filled 2016-02-17: qty 5

## 2016-02-17 MED ORDER — ONDANSETRON HCL 4 MG/2ML IJ SOLN
INTRAMUSCULAR | Status: DC | PRN
  Administered 2016-02-17: 4 mg via INTRAVENOUS

## 2016-02-17 MED ORDER — SUCCINYLCHOLINE CHLORIDE 20 MG/ML IJ SOLN
INTRAMUSCULAR | Status: DC | PRN
  Administered 2016-02-17: 100 mg via INTRAVENOUS

## 2016-02-17 MED ORDER — MORPHINE SULFATE (PF) 2 MG/ML IV SOLN: 0.5000 mg | INTRAVENOUS | Status: DC | PRN

## 2016-02-17 MED ORDER — ACETAMINOPHEN 650 MG RE SUPP: 650.0000 mg | Freq: Four times a day (QID) | RECTAL | Status: DC | PRN

## 2016-02-17 MED ORDER — DEXAMETHASONE SODIUM PHOSPHATE 10 MG/ML IJ SOLN
INTRAMUSCULAR | Status: DC | PRN
  Administered 2016-02-17: 4 mg via INTRAVENOUS

## 2016-02-17 MED ORDER — SODIUM CHLORIDE 0.9 % IJ SOLN
INTRAMUSCULAR | Status: AC
  Filled 2016-02-17: qty 10

## 2016-02-17 MED ORDER — METOCLOPRAMIDE HCL 5 MG PO TABS: 5.0000 mg | ORAL_TABLET | Freq: Three times a day (TID) | ORAL | Status: DC | PRN

## 2016-02-17 MED ORDER — METOCLOPRAMIDE HCL 5 MG/ML IJ SOLN: 5.0000 mg | Freq: Three times a day (TID) | INTRAMUSCULAR | Status: DC | PRN

## 2016-02-17 MED ORDER — ROCURONIUM BROMIDE 50 MG/5ML IV SOLN
INTRAVENOUS | Status: AC
  Filled 2016-02-17: qty 1

## 2016-02-17 MED ORDER — LORAZEPAM 0.5 MG PO TABS
0.5000 mg | ORAL_TABLET | Freq: Every day | ORAL | Status: DC
Start: 2016-02-17 — End: 2016-02-19
  Administered 2016-02-17 – 2016-02-18 (×2): 0.5 mg via ORAL
  Filled 2016-02-17 (×2): qty 1

## 2016-02-17 MED ORDER — LACTATED RINGERS IV SOLN
INTRAVENOUS | Status: DC | PRN
  Administered 2016-02-17 (×2): via INTRAVENOUS

## 2016-02-17 MED ORDER — METOPROLOL TARTRATE 50 MG PO TABS
50.0000 mg | ORAL_TABLET | Freq: Two times a day (BID) | ORAL | Status: DC
  Administered 2016-02-17 – 2016-02-18 (×3): 50 mg via ORAL
  Filled 2016-02-17 (×5): qty 1

## 2016-02-17 MED ORDER — ACETAMINOPHEN 10 MG/ML IV SOLN
1000.0000 mg | INTRAVENOUS | Status: AC
  Administered 2016-02-17: 1000 mg via INTRAVENOUS
  Filled 2016-02-17: qty 100

## 2016-02-17 MED ORDER — ACETAMINOPHEN 325 MG PO TABS
650.0000 mg | ORAL_TABLET | Freq: Four times a day (QID) | ORAL | Status: DC | PRN
  Administered 2016-02-19: 650 mg via ORAL
  Filled 2016-02-17: qty 2

## 2016-02-17 MED ORDER — LIDOCAINE HCL (CARDIAC) 20 MG/ML IV SOLN
INTRAVENOUS | Status: DC | PRN
  Administered 2016-02-17: 60 mg via INTRAVENOUS

## 2016-02-17 MED ORDER — SUCCINYLCHOLINE CHLORIDE 20 MG/ML IJ SOLN
INTRAMUSCULAR | Status: AC
  Filled 2016-02-17: qty 1

## 2016-02-17 SURGICAL SUPPLY — 53 items
ADH SKN CLS APL DERMABOND .7 (GAUZE/BANDAGES/DRESSINGS) ×1
BLADE SAW SGTL 18X1.27X75 (BLADE) ×2 IMPLANT
BLADE SURG ROTATE 9660 (MISCELLANEOUS) ×1 IMPLANT
CAPT HIP HEMI 2 ×1 IMPLANT
CHLORAPREP W/TINT 26ML (MISCELLANEOUS) ×2 IMPLANT
COVER SURGICAL LIGHT HANDLE (MISCELLANEOUS) ×2 IMPLANT
DERMABOND ADVANCED (GAUZE/BANDAGES/DRESSINGS) ×1
DERMABOND ADVANCED .7 DNX12 (GAUZE/BANDAGES/DRESSINGS) ×2 IMPLANT
DRAPE C-ARM 42X72 X-RAY (DRAPES) ×2 IMPLANT
DRAPE IMP U-DRAPE 54X76 (DRAPES) ×4 IMPLANT
DRAPE STERI IOBAN 125X83 (DRAPES) ×2 IMPLANT
DRAPE U-SHAPE 47X51 STRL (DRAPES) ×6 IMPLANT
DRSG AQUACEL AG ADV 3.5X10 (GAUZE/BANDAGES/DRESSINGS) ×2 IMPLANT
ELECT BLADE 4.0 EZ CLEAN MEGAD (MISCELLANEOUS) ×2
ELECT REM PT RETURN 9FT ADLT (ELECTROSURGICAL) ×2
ELECTRODE BLDE 4.0 EZ CLN MEGD (MISCELLANEOUS) ×1 IMPLANT
ELECTRODE REM PT RTRN 9FT ADLT (ELECTROSURGICAL) ×1 IMPLANT
EVACUATOR 1/8 PVC DRAIN (DRAIN) IMPLANT
GLOVE BIO SURGEON STRL SZ8.5 (GLOVE) ×4 IMPLANT
GLOVE BIOGEL PI IND STRL 8.5 (GLOVE) ×1 IMPLANT
GLOVE BIOGEL PI INDICATOR 8.5 (GLOVE) ×1
GOWN STRL REUS W/ TWL LRG LVL3 (GOWN DISPOSABLE) ×2 IMPLANT
GOWN STRL REUS W/TWL 2XL LVL3 (GOWN DISPOSABLE) ×2 IMPLANT
GOWN STRL REUS W/TWL LRG LVL3 (GOWN DISPOSABLE) ×4
HANDPIECE INTERPULSE COAX TIP (DISPOSABLE) ×2
HOOD PEEL AWAY FACE SHEILD DIS (HOOD) ×4 IMPLANT
KIT BASIN OR (CUSTOM PROCEDURE TRAY) ×2 IMPLANT
KIT ROOM TURNOVER OR (KITS) ×2 IMPLANT
MANIFOLD NEPTUNE II (INSTRUMENTS) ×2 IMPLANT
MARKER SKIN DUAL TIP RULER LAB (MISCELLANEOUS) ×2 IMPLANT
NDL SPNL 18GX3.5 QUINCKE PK (NEEDLE) ×1 IMPLANT
NEEDLE SPNL 18GX3.5 QUINCKE PK (NEEDLE) IMPLANT
NS IRRIG 1000ML POUR BTL (IV SOLUTION) ×2 IMPLANT
PACK TOTAL JOINT (CUSTOM PROCEDURE TRAY) ×2 IMPLANT
PACK UNIVERSAL I (CUSTOM PROCEDURE TRAY) ×2 IMPLANT
PAD ARMBOARD 7.5X6 YLW CONV (MISCELLANEOUS) ×4 IMPLANT
SEALER BIPOLAR AQUA 6.0 (INSTRUMENTS) ×1 IMPLANT
SET HNDPC FAN SPRY TIP SCT (DISPOSABLE) ×1 IMPLANT
SUCTION FRAZIER HANDLE 10FR (MISCELLANEOUS) ×1
SUCTION TUBE FRAZIER 10FR DISP (MISCELLANEOUS) ×1 IMPLANT
SUT ETHIBOND NAB CT1 #1 30IN (SUTURE) ×4 IMPLANT
SUT MNCRL AB 3-0 PS2 18 (SUTURE) ×2 IMPLANT
SUT MON AB 2-0 CT1 36 (SUTURE) ×2 IMPLANT
SUT VIC AB 1 CT1 27 (SUTURE) ×2
SUT VIC AB 1 CT1 27XBRD ANBCTR (SUTURE) ×1 IMPLANT
SUT VIC AB 2-0 CT1 27 (SUTURE) ×2
SUT VIC AB 2-0 CT1 TAPERPNT 27 (SUTURE) ×1 IMPLANT
SUT VLOC 180 0 24IN GS25 (SUTURE) ×2 IMPLANT
SYR 50ML LL SCALE MARK (SYRINGE) ×2 IMPLANT
TOWEL OR 17X24 6PK STRL BLUE (TOWEL DISPOSABLE) ×2 IMPLANT
TOWEL OR 17X26 10 PK STRL BLUE (TOWEL DISPOSABLE) ×2 IMPLANT
TRAY FOLEY CATH 16FR SILVER (SET/KITS/TRAYS/PACK) IMPLANT
WATER STERILE IRR 1000ML POUR (IV SOLUTION) ×3 IMPLANT

## 2016-02-17 NOTE — Care Management Note (Signed)
Case Management Note  Patient Details  Name: Marlane HatcherHelen Schroeck MRN: [id number] Date of Birth: [birth year]  Subjective/Objective:    82 yr old female with right hip hemiarthroplasty , s/p fall.               Action/Plan:  MD has informed Case manager that patient will need SNFwhen medically stable for discharge. Social worker notified.  Expected Discharge Date:                  Expected Discharge Plan:     In-House Referral:     Discharge planning Services     Post Acute Care Choice:    Choice offered to:     DME Arranged:    DME Agency:     HH Arranged:    HH Agency:     Status of Service:   in process  Medicare Important Message Given:    Date Medicare IM Given:    Medicare IM give by:    Date Additional Medicare IM Given:    Additional Medicare Important Message give by:     If discussed at Long Length of Stay Meetings, dates discussed:    Additional Comments:  Durenda GuthrieBrady, Aubrea Meixner Naomi, RN 02/17/2016, 12:21 PM

## 2016-02-17 NOTE — Anesthesia Preprocedure Evaluation (Signed)
Anesthesia Evaluation  Patient identified by MRN, date of birth, ID band Patient confused    Reviewed: Allergy & Precautions, NPO status , Patient's Chart, lab work & pertinent test results  Airway Mallampati: II  TM Distance: >3 FB Neck ROM: Full    Dental  (+) Dental Advisory Given   Pulmonary neg pulmonary ROS,    breath sounds clear to auscultation       Cardiovascular negative cardio ROS   Rhythm:Regular Rate:Normal     Neuro/Psych Dementia CVA, No Residual Symptoms    GI/Hepatic Neg liver ROS, GERD  ,  Endo/Other  negative endocrine ROS  Renal/GU negative Renal ROS     Musculoskeletal negative musculoskeletal ROS (+)   Abdominal   Peds  Hematology negative hematology ROS (+)   Anesthesia Other Findings   Reproductive/Obstetrics                             Lab Results  Component Value Date   WBC 7.7 02/17/2016   HGB 12.5 02/17/2016   HCT 37.7 02/17/2016   MCV 90.0 02/17/2016   PLT 228 02/17/2016   Lab Results  Component Value Date   CREATININE 0.80 02/17/2016   BUN 17 02/17/2016   NA 141 02/17/2016   K 3.7 02/17/2016   CL 107 02/17/2016   CO2 23 02/17/2016    Anesthesia Physical Anesthesia Plan  ASA: III  Anesthesia Plan: General   Post-op Pain Management:    Induction: Intravenous  Airway Management Planned: Oral ETT  Additional Equipment:   Intra-op Plan:   Post-operative Plan: Extubation in OR  Informed Consent: I have reviewed the patients History and Physical, chart, labs and discussed the procedure including the risks, benefits and alternatives for the proposed anesthesia with the patient or authorized representative who has indicated his/her understanding and acceptance.   Dental advisory given  Plan Discussed with: CRNA  Anesthesia Plan Comments:         Anesthesia Quick Evaluation

## 2016-02-17 NOTE — Anesthesia Procedure Notes (Signed)
Procedure Name: Intubation Date/Time: 02/17/2016 7:37 AM Performed by: Izora Gala Pre-anesthesia Checklist: Patient identified, Emergency Drugs available, Suction available and Patient being monitored Patient Re-evaluated:Patient Re-evaluated prior to inductionOxygen Delivery Method: Circle system utilized Preoxygenation: Pre-oxygenation with 100% oxygen Intubation Type: IV induction Ventilation: Mask ventilation without difficulty Laryngoscope Size: Miller and 3 Grade View: Grade II Tube type: Oral Tube size: 7.0 mm Airway Equipment and Method: Stylet and LTA kit utilized Placement Confirmation: ETT inserted through vocal cords under direct vision,  positive ETCO2 and breath sounds checked- equal and bilateral Secured at: 22 cm Tube secured with: Tape Dental Injury: Teeth and Oropharynx as per pre-operative assessment

## 2016-02-17 NOTE — H&P (View-Only) (Signed)
Jamie Dubonnet, MD Chief Complaint: Right hip fracture History: HPI: [name] is a 82 y.o. female with a past medical history that includes advancing dementia, GERD, gait abnormality, stroke 1985 with no deficits, as as to the emergency department with the chief complaint of fall. Initial evaluation in the emergency department reveals right femoral neck fracture as well as hypertension. Past Medical History  Diagnosis Date  . Anxiety   . GERD (gastroesophageal reflux disease)   . Insomnia   . Memory loss   . Lumbago   . dementia   . Stroke [facility]) 1985    no deficits    No Known Allergies  No current facility-administered medications on file prior to encounter.   Current Outpatient Prescriptions on File Prior to Encounter  Medication Sig Dispense Refill  . Cyanocobalamin (VITAMIN B 12 PO) Take 1 tablet by mouth daily.     Marland Kitchen donepezil (ARICEPT) 10 MG tablet Take 1 tablet (10 mg total) by mouth at bedtime. 30 tablet 7  . fluPHENAZine decanoate (PROLIXIN) 25 MG/ML injection Inject 25 mg into the muscle every 21 ( twenty-one) days.     . memantine (NAMENDA XR) 28 MG CP24 24 hr capsule Take 1 capsule (28 mg total) by mouth at bedtime. 30 capsule 7  . QUEtiapine (SEROQUEL) 50 MG tablet Take 50 mg by mouth at bedtime.    . raloxifene (EVISTA) 60 MG tablet Take 60 mg by mouth daily.     . Vitamin D, Cholecalciferol, 1000 UNITS TABS Take 2,000 Units by mouth daily.    Marland Kitchen aspirin EC 325 MG EC tablet Take 1 tablet (325 mg total) by mouth 2 (two) times daily. (Patient not taking: Reported on 02/16/2016) 30 tablet 0    Physical Exam: Filed Vitals:   02/16/16 1015 02/16/16 1030  BP: 158/116 184/103  Pulse: 94 84  Temp:    Resp:     A+OX3 NVI  Compartments soft/nt No SOB/CP Abd soft/NT Right hip pain with palpation Intact 1+ DP/PT   Image: Dg Chest 1 View  02/16/2016  CLINICAL DATA:  Fall today, right hip pain EXAM: CHEST 1 VIEW COMPARISON:  02/06/2014 FINDINGS:  Cardiomediastinal silhouette is stable. Mild elevation of the right hemidiaphragm. No infiltrate or pulmonary edema. Atherosclerotic calcifications of thoracic aorta. There is no pneumothorax. IMPRESSION: No infiltrate or pulmonary edema. Mild elevation of the right hemidiaphragm again noted. No pneumothorax. Electronically Signed   By: Natasha Mead M.D.   On: 02/16/2016 09:23   Ct Head Wo Contrast  02/16/2016  CLINICAL DATA:  Status post fall earlier today EXAM: CT HEAD WITHOUT CONTRAST CT CERVICAL SPINE WITHOUT CONTRAST TECHNIQUE: Multidetector CT imaging of the head and cervical spine was performed following the standard protocol without intravenous contrast. Multiplanar CT image reconstructions of the cervical spine were also generated. COMPARISON:  Head CT February 09, 2016 FINDINGS: CT HEAD FINDINGS Moderate diffuse atrophy is stable. There is no intracranial mass, hemorrhage, extra-axial fluid collection, or midline shift. Patchy small vessel disease in the centra semiovale bilaterally is stable. There is no new gray-white compartment lesion. No acute infarct evident. Basal ganglia calcification is noted bilaterally. Bony calvarium appears intact. The mastoid air cells are clear. No intraorbital lesions are apparent. CT CERVICAL SPINE FINDINGS There is no fracture or spondylolisthesis. Prevertebral soft tissues and predental space regions are normal. There is moderate disc space narrowing at C4-5, C5-6, and C6-7. There is facet hypertrophy at multiple levels bilaterally. There is exit foraminal narrowing at multiple levels, most notably  at C3-4 on the left, C4-5 on the right, C5-6 on the right, and C6-7 bilaterally. IMPRESSION: CT head: Moderate generalized atrophy with patchy periventricular small vessel disease. No acute infarct evident. No hemorrhage, mass, or extra-axial fluid collection. CT cervical spine: Multilevel arthropathy. No demonstrable fracture or spondylolisthesis. Electronically Signed   By:  Bretta BangWilliam  Woodruff III M.D.   On: 02/16/2016 09:42   Ct Head Wo Contrast  02/09/2016  CLINICAL DATA:  Confusion.  History of dementia EXAM: CT HEAD WITHOUT CONTRAST TECHNIQUE: Contiguous axial images were obtained from the base of the skull through the vertex without intravenous contrast. COMPARISON:  January 17, 2011 FINDINGS: Moderate diffuse atrophy is stable. There is no intracranial mass, hemorrhage, extra-axial fluid collection, or midline shift. There is patchy small vessel disease in the centra semiovale bilaterally. Elsewhere, gray-white compartments appear normal. No acute infarct is evident. There are basal ganglia calcifications bilaterally, a stable finding. The bony calvarium appears intact. The mastoid air cells are clear. No intraorbital lesions are identified. IMPRESSION: Moderate atrophy with patchy small vessel disease in the centra semiovale bilaterally. No intracranial mass, hemorrhage, or acute appearing infarct. Basal ganglia calcification is likely physiologic in this age group, stable. Electronically Signed   By: Bretta BangWilliam  Woodruff III M.D.   On: 02/09/2016 13:36   Ct Cervical Spine Wo Contrast  02/16/2016  CLINICAL DATA:  Status post fall earlier today EXAM: CT HEAD WITHOUT CONTRAST CT CERVICAL SPINE WITHOUT CONTRAST TECHNIQUE: Multidetector CT imaging of the head and cervical spine was performed following the standard protocol without intravenous contrast. Multiplanar CT image reconstructions of the cervical spine were also generated. COMPARISON:  Head CT February 09, 2016 FINDINGS: CT HEAD FINDINGS Moderate diffuse atrophy is stable. There is no intracranial mass, hemorrhage, extra-axial fluid collection, or midline shift. Patchy small vessel disease in the centra semiovale bilaterally is stable. There is no new gray-white compartment lesion. No acute infarct evident. Basal ganglia calcification is noted bilaterally. Bony calvarium appears intact. The mastoid air cells are clear. No  intraorbital lesions are apparent. CT CERVICAL SPINE FINDINGS There is no fracture or spondylolisthesis. Prevertebral soft tissues and predental space regions are normal. There is moderate disc space narrowing at C4-5, C5-6, and C6-7. There is facet hypertrophy at multiple levels bilaterally. There is exit foraminal narrowing at multiple levels, most notably at C3-4 on the left, C4-5 on the right, C5-6 on the right, and C6-7 bilaterally. IMPRESSION: CT head: Moderate generalized atrophy with patchy periventricular small vessel disease. No acute infarct evident. No hemorrhage, mass, or extra-axial fluid collection. CT cervical spine: Multilevel arthropathy. No demonstrable fracture or spondylolisthesis. Electronically Signed   By: Bretta BangWilliam  Woodruff III M.D.   On: 02/16/2016 09:42   Dg Hip Unilat With Pelvis 2-3 Views Right  02/16/2016  CLINICAL DATA:  Fall today, right hip pain EXAM: DG HIP (WITH OR WITHOUT PELVIS) 2-3V RIGHT COMPARISON:  None. FINDINGS: Three views of the right hip submitted. There is mild displaced fracture of the right femoral neck. Diffuse osteopenia. Left hip prosthesis partially visualized. There is anatomic alignment of left hip prosthesis. IMPRESSION: Mild displaced fracture of the right femoral neck. Diffuse osteopenia. Partially visualized left hip prosthesis with anatomic alignment. Electronically Signed   By: Natasha MeadLiviu  Pop M.D.   On: 02/16/2016 09:22    A/P:  Patient s/p fall with inability to ambulate and right hip pain Agree with radiologist about hip fracture Family wishes someone from my practice to address fx ASAP Dr Merita NortonSwimteck is available on Sunday.  Per their request I will see if someone is available on Saturday Admit to medical team Teds/SCD's for DVT prevention Minimal narcotics for pain control given baseline dementia

## 2016-02-17 NOTE — Op Note (Signed)
OPERATIVE REPORT  SURGEON: Samson FredericBrian Felita Bump, MD   ASSISTANT: Bill SalinasJacklyn Bissel, PA-C.  PREOPERATIVE DIAGNOSIS: Displaced Right femoral neck fracture.   POSTOPERATIVE DIAGNOSIS: Displaced Right femoral neck fracture.   PROCEDURE: Right hip hemiarthroplasty, anterior approach.   IMPLANTS: DePuy Tri Lock stem, size 6, std offset, with a -3 mm spacer and a 46 mm monopolar head ball.  ANESTHESIA:  General  ANTIBIOTICS: 2g ancef.  ESTIMATED BLOOD LOSS: 150 mL.  DRAINS: None.  COMPLICATIONS: None   CONDITION: PACU - hemodynamically stable.   BRIEF CLINICAL NOTE: Jamie HatcherHelen Barber is a 82 y.o. female with a displaced Right femoral neck fracture. The patient was admitted to the hospitalist service and underwent perioperative risk stratification and medical optimization. The risks, benefits, and alternatives to hemiarthroplasty were explained, and the patient elected to proceed.  PROCEDURE IN DETAIL: The patient was taken to the operating room and general anesthesia was induced on the hospital bed. The patient was then positioned on the Hana table. All bony prominences were well padded. The hip was prepped and draped in the normal sterile surgical fashion. A time-out was called verifying side and site of surgery. Antibiotics were given within 60 minutes of beginning the procedure.  The direct anterior approach to the hip was performed through the Hueter interval. Lateral femoral circumflex vessels were treated with the Auqumantys. The anterior capsule was exposed and an inverted T capsulotomy was made. Fracture hematoma was encountered and evacuated. The patient was found to have a comminuted Right subcapital femoral neck fracture. I freshened the femoral neck cut with a saw. I removed the femoral neck fragment. A corkscrew was placed into the head and the head was removed. This was passed to the back table and was measured.  Acetabular exposure was achieved. I examined the  articular cartilage which was intact. The labrum was intact. A 46 mm trial head was placed and found to have excellent fit.  I then gained femoral exposure taking care to protect the abductors and greater trochanter. This was performed using standard external rotation, extension, and adduction. The capsule was peeled off the inner aspect of the greater trochanter, taking care to preserve the short external rotators. A cookie cutter was used to enter the femoral canal, and then the femoral canal finder was used to confirm location. I then sequentially broached up to a size 6. Calcar planer was used on the femoral neck remnant. I paced a std neck and a 36+ 0 head ball.The hip was reduced. Leg lengths were checked fluoroscopically. The hip was dislocated and trial components were removed. I placed the real stem followed by the real spacer and head ball. A single reduction maneuver was performed and the hip was reduced. Fluoroscopy was used to confirm component position and leg lengths. At 90 degrees of external rotation and extension, the hip was stable to an anterior directed force.  The wound was copiously irrigated with normal saline solution. Marcaine solution was injected into the periarticular soft tissue. The wound was closed in layers using #1 Vicryl and V-Loc for the fascia, 2-0 Vicryl for the subcutaneous fat, 2-0 Monocryl for the deep dermal layer, 3-0 running Monocryl subcuticular stitch and glue for the skin. Once the glue was fully dried, an Aquacell Ag dressing was applied. The patient was then awakened from anesthesia and transported to the recovery room in stable condition. Sponge, needle, and instrument counts were correct at the end of the case x2. The patient tolerated the procedure well and there were no known  complications.  Please note that a surgical assistant was a medical necessity for this procedure to perform it in a safe and expeditious manner. Assistant was  necessary to provide appropriate retraction of vital neurovascular structures, to prevent femoral fracture, and to allow for anatomic placement of the prosthesis.

## 2016-02-17 NOTE — Interval H&P Note (Signed)
History and Physical Interval Note:  02/17/2016 7:22 AM  Jamie HatcherHelen Barber  has presented today for surgery, with the diagnosis of displaced right femoral neck fx  The various methods of treatment have been discussed with the patient and family. After consideration of risks, benefits and other options for treatment, the patient has consented to  Procedure(s): ANTERIOR APPROACH HEMI HIP ARTHROPLASTY (Right) as a surgical intervention .  The patient's history has been reviewed, patient examined, no change in status, stable for surgery.  I have reviewed the patient's chart and labs.  Questions were answered to the patient's satisfaction.    The risks, benefits, and alternatives were discussed with the patient. There are risks associated with the surgery including, but not limited to, problems with anesthesia (death), infection, instability (giving out of the joint), dislocation, differences in leg length/angulation/rotation, fracture of bones, loosening or failure of implants, hematoma (blood accumulation) which may require surgical drainage, blood clots, pulmonary embolism, nerve injury (foot drop and lateral thigh numbness), and blood vessel injury. The patient understands these risks and elects to proceed.    Kevia Zaucha, Cloyde ReamsBrian James

## 2016-02-17 NOTE — Evaluation (Signed)
PSLP Cancellation Note  Patient Details Name: Jamie HatcherHelen Barber MRN: [id number] DOB: January 04, 1932   Cancelled treatment:        Pt not awake from surgery. Unable to evaluation. Will evaluate tomorrow.                                                                                                Lindalou HoseSarah J Amada Hallisey 02/17/2016, 4:30 PM

## 2016-02-17 NOTE — Care Management (Signed)
Utilization review completed. Billy Turvey, RN Case Manager [phone]. 

## 2016-02-17 NOTE — Progress Notes (Addendum)
Patient Demographics:    [name], is a 82 y.o. female, DOB - Nov 26, 1932, ZOX:[id number]  Admit date - 02/16/2016   Admitting Physician Ozella Rocks, MD  Outpatient Primary MD for the patient is [name], MD  LOS - 1   Chief Complaint  Patient presents with  . Fall        Subjective:    [name] today has, No headache, No chest pain, No abdominal pain - No Nausea, No new weakness tingling or numbness, No Cough - SOB.     Assessment  & Plan :     1.Mechanical fall at home with right femoral neck fracture. Status post right hip hemiarthroplasty on 02/17/2016 by Dr. Lisette Grinder, patient seems to have tolerated the procedure well, aspirin 325 twice a day per orthopedics for DVT prophylaxis, weightbearing as tolerated, PT evaluate will likely require SNF. Minimal perioperative blood loss, expect mild anemia, likely will not require transfusion.  2. Underlying dementia. At risk for delirium, continue home dose Aricept, memantine and Seroquel. Minimize narcotics and benzodiazepines.  3. Hypertension. Added Norvasc, as needed IV hydralazine and Lopressor. Pain control.  4. Osteoporosis. Continue vitamin D supplementation along with raloxifene.  5. Brief Run of SVT at 4pm - EKG, TSH, PO lopressor scheduled and IV PRN, now in NSR, Tele.    Code Status : DO NOT RESUSCITATE  Family Communication  : Daughter-in-law  Disposition Plan  : SNF  Consults  :  Orthopedics  Procedures  :  Right hip open reduction internal fixation with hemiarthroplasty with anterior approach  DVT Prophylaxis  :  Aspirin 325 twice a day per orthopedics  Lab Results  Component Value Date   PLT 228 02/17/2016    Inpatient Medications  Scheduled Meds: . amLODipine  10 mg Oral Daily  . aspirin EC   325 mg Oral BID PC  .  ceFAZolin (ANCEF) IV  2 g Intravenous Q6H  . donepezil  10 mg Oral QHS  . memantine  28 mg Oral Daily  . QUEtiapine  50 mg Oral QHS  . raloxifene  60 mg Oral Daily  . trihexyphenidyl  2 mg Oral BID WC   Continuous Infusions: . sodium chloride     PRN Meds:.acetaminophen **OR** acetaminophen, hydrALAZINE, HYDROcodone-acetaminophen, LORazepam, metoprolol, morphine injection, [DISCONTINUED] ondansetron **OR** ondansetron (ZOFRAN) IV  Antibiotics  :    Anti-infectives    Start     Dose/Rate Route Frequency Ordered Stop   02/17/16 1100  ceFAZolin (ANCEF) IVPB 2 g/50 mL premix     2 g 100 mL/hr over 30 Minutes Intravenous Every 6 hours 02/17/16 1048 02/17/16 2259   02/17/16 0715  ceFAZolin (ANCEF) IVPB 2 g/50 mL premix     2 g 100 mL/hr over 30 Minutes Intravenous To Surgery 02/17/16 0708 02/17/16 0800        Objective:   Filed Vitals:   02/17/16 0940 02/17/16 0955 02/17/16 1010 02/17/16 1034  BP: 106/90 138/86 150/87 166/85  Pulse: 103 100 99 100  Temp: 97.9 F (36.6 C)  98 F (36.7 C) 98.1 F (36.7 C)  TempSrc:      Resp: SpO2: 98% 98% 98% 95%    Wt Readings from Last 3 Encounters:  10/04/15 77.747 kg (171 lb 6.4 oz)  04/03/15 78.382 kg (172 lb 12.8 oz)  10/12/14 80.287 kg (177 lb)     Intake/Output Summary (Last 24 hours) at 02/17/16 1225 Last data filed at 02/17/16 0900  Gross per 24 hour  Intake   1420 ml  Output   1500 ml  Net    -80 ml     Physical Exam  Awake Alert, Oriented X 3, No new F.N deficits, Normal affect [place].AT,PERRAL Supple Neck,No JVD, No cervical lymphadenopathy appriciated.  Symmetrical Chest wall movement, Good air movement bilaterally, CTAB RRR,No Gallops,Rubs or new Murmurs, No Parasternal Heave +ve B.Sounds, Abd Soft, No tenderness, No organomegaly appriciated, No rebound - guarding or rigidity. No Cyanosis, Clubbing or edema, No new Rash or bruise     Data Review:   Micro Results Recent  Results (from the past 240 hour(s))  Surgical pcr screen     Status: None   Collection Time: 02/16/16  8:33 PM  Result Value Ref Range Status   MRSA, PCR NEGATIVE NEGATIVE Final   Staphylococcus aureus NEGATIVE NEGATIVE Final    Comment:        The Xpert SA Assay (FDA approved for NASAL specimens in patients over 80 years of age), is one component of a comprehensive surveillance program.  Test performance has been validated by [facility] for patients greater than or equal to 80 year old. It is not intended to diagnose infection nor to guide or monitor treatment.     Radiology Reports Dg Chest 1 View  02/16/2016  CLINICAL DATA:  Fall today, right hip pain EXAM: CHEST 1 VIEW COMPARISON:  02/06/2014 FINDINGS: Cardiomediastinal silhouette is stable. Mild elevation of the right hemidiaphragm. No infiltrate or pulmonary edema. Atherosclerotic calcifications of thoracic aorta. There is no pneumothorax. IMPRESSION: No infiltrate or pulmonary edema. Mild elevation of the right hemidiaphragm again noted. No pneumothorax. Electronically Signed   By: Natasha MeadLiviu  Pop M.D.   On: 02/16/2016 09:23   Ct Head Wo Contrast  02/16/2016  CLINICAL DATA:  Status post fall earlier today EXAM: CT HEAD WITHOUT CONTRAST CT CERVICAL SPINE WITHOUT CONTRAST TECHNIQUE: Multidetector CT imaging of the head and cervical spine was performed following the standard protocol without intravenous contrast. Multiplanar CT image reconstructions of the cervical spine were also generated. COMPARISON:  Head CT February 09, 2016 FINDINGS: CT HEAD FINDINGS Moderate diffuse atrophy is stable. There is no intracranial mass, hemorrhage, extra-axial fluid collection, or midline shift. Patchy small vessel disease in the centra semiovale bilaterally is stable. There is no new gray-white compartment lesion. No acute infarct evident. Basal ganglia calcification is noted bilaterally. Bony calvarium appears intact. The mastoid air cells are clear. No  intraorbital lesions are apparent. CT CERVICAL SPINE FINDINGS There is no fracture or spondylolisthesis. Prevertebral soft tissues and predental space regions are normal. There is moderate disc space narrowing at C4-5, C5-6, and C6-7. There is facet hypertrophy at multiple levels bilaterally. There is exit foraminal narrowing at multiple levels, most notably at C3-4 on the left, C4-5 on the right, C5-6 on the right, and C6-7 bilaterally. IMPRESSION: CT head: Moderate generalized atrophy with patchy periventricular small vessel disease. No acute infarct evident. No hemorrhage, mass, or extra-axial fluid collection. CT cervical spine: Multilevel arthropathy. No demonstrable fracture or spondylolisthesis. Electronically Signed   By: Bretta BangWilliam  Woodruff III M.D.   On: 02/16/2016 09:42   Ct Head Wo Contrast  02/09/2016  CLINICAL DATA:  Confusion.  History of dementia EXAM: CT HEAD WITHOUT  CONTRAST TECHNIQUE: Contiguous axial images were obtained from the base of the skull through the vertex without intravenous contrast. COMPARISON:  January 17, 2011 FINDINGS: Moderate diffuse atrophy is stable. There is no intracranial mass, hemorrhage, extra-axial fluid collection, or midline shift. There is patchy small vessel disease in the centra semiovale bilaterally. Elsewhere, gray-white compartments appear normal. No acute infarct is evident. There are basal ganglia calcifications bilaterally, a stable finding. The bony calvarium appears intact. The mastoid air cells are clear. No intraorbital lesions are identified. IMPRESSION: Moderate atrophy with patchy small vessel disease in the centra semiovale bilaterally. No intracranial mass, hemorrhage, or acute appearing infarct. Basal ganglia calcification is likely physiologic in this age group, stable. Electronically Signed   By: Bretta Bang III M.D.   On: 02/09/2016 13:36   Ct Cervical Spine Wo Contrast  02/16/2016  CLINICAL DATA:  Status post fall earlier today EXAM: CT  HEAD WITHOUT CONTRAST CT CERVICAL SPINE WITHOUT CONTRAST TECHNIQUE: Multidetector CT imaging of the head and cervical spine was performed following the standard protocol without intravenous contrast. Multiplanar CT image reconstructions of the cervical spine were also generated. COMPARISON:  Head CT February 09, 2016 FINDINGS: CT HEAD FINDINGS Moderate diffuse atrophy is stable. There is no intracranial mass, hemorrhage, extra-axial fluid collection, or midline shift. Patchy small vessel disease in the centra semiovale bilaterally is stable. There is no new gray-white compartment lesion. No acute infarct evident. Basal ganglia calcification is noted bilaterally. Bony calvarium appears intact. The mastoid air cells are clear. No intraorbital lesions are apparent. CT CERVICAL SPINE FINDINGS There is no fracture or spondylolisthesis. Prevertebral soft tissues and predental space regions are normal. There is moderate disc space narrowing at C4-5, C5-6, and C6-7. There is facet hypertrophy at multiple levels bilaterally. There is exit foraminal narrowing at multiple levels, most notably at C3-4 on the left, C4-5 on the right, C5-6 on the right, and C6-7 bilaterally. IMPRESSION: CT head: Moderate generalized atrophy with patchy periventricular small vessel disease. No acute infarct evident. No hemorrhage, mass, or extra-axial fluid collection. CT cervical spine: Multilevel arthropathy. No demonstrable fracture or spondylolisthesis. Electronically Signed   By: Bretta Bang III M.D.   On: 02/16/2016 09:42   Pelvis Portable  02/17/2016  CLINICAL DATA:  Anterior approach right hemiarthroplasty right hip EXAM: PORTABLE PELVIS 1-2 VIEWS COMPARISON:  03/04/2014 FINDINGS: Stable left hip arthroplasty. Pelvic bones intact. Patient now status post right hip arthroplasty with anticipated soft tissue change over the right hip joint postoperatively. IMPRESSION: Anticipated postoperative appearance Electronically Signed   By:  Esperanza Heir M.D.   On: 02/17/2016 10:21   Dg Hip Operative Unilat With Pelvis Right  02/17/2016  CLINICAL DATA:  Right hip replacement. EXAM: OPERATIVE right HIP (WITH PELVIS IF PERFORMED)  VIEWS TECHNIQUE: Fluoroscopic spot image(s) were submitted for interpretation post-operatively. COMPARISON:  February 16, 2016 FLUOROSCOPY TIME:  6 seconds. FINDINGS: Three images were obtained during a right hip replacement. By the end of the study, acetabular and femoral components are in good position. IMPRESSION: Right hip replacement as described above. Electronically Signed   By: Gerome Sam III M.D   On: 02/17/2016 09:20   Dg Hip Unilat With Pelvis 2-3 Views Right  02/16/2016  CLINICAL DATA:  Fall today, right hip pain EXAM: DG HIP (WITH OR WITHOUT PELVIS) 2-3V RIGHT COMPARISON:  None. FINDINGS: Three views of the right hip submitted. There is mild displaced fracture of the right femoral neck. Diffuse osteopenia. Left hip prosthesis partially visualized. There is anatomic  alignment of left hip prosthesis. IMPRESSION: Mild displaced fracture of the right femoral neck. Diffuse osteopenia. Partially visualized left hip prosthesis with anatomic alignment. Electronically Signed   By: Natasha Mead M.D.   On: 02/16/2016 09:22     CBC  Recent Labs Lab 02/16/16 0859 02/17/16 0530  WBC 12.4* 7.7  HGB 13.0 12.5  HCT 39.3 37.7  PLT 247 228  MCV 90.3 90.0  MCH 29.9 29.8  MCHC 33.1 33.2  RDW 12.8 13.1  LYMPHSABS 0.7  --   MONOABS 0.5  --   EOSABS 0.0  --   BASOSABS 0.0  --     Chemistries   Recent Labs Lab 02/16/16 0859 02/17/16 0530  NA 142 141  K 3.7 3.7  CL 104 107  CO2 27 23  GLUCOSE 121* 119*  BUN 15 17  CREATININE 0.76 0.80  CALCIUM 9.3 8.9   ------------------------------------------------------------------------------------------------------------------ No results for input(s): CHOL, HDL, LDLCALC, TRIG, CHOLHDL, LDLDIRECT in the last 72 hours.  No results found for:  HGBA1C ------------------------------------------------------------------------------------------------------------------  Recent Labs  02/16/16 1246  TSH 1.313   ------------------------------------------------------------------------------------------------------------------ No results for input(s): VITAMINB12, FOLATE, FERRITIN, TIBC, IRON, RETICCTPCT in the last 72 hours.  Coagulation profile  Recent Labs Lab 02/16/16 1819  INR 1.07    No results for input(s): DDIMER in the last 72 hours.  Cardiac Enzymes  Recent Labs Lab 02/16/16 1149 02/16/16 1819 02/16/16 2249  TROPONINI <0.03 <0.03 <0.03   ------------------------------------------------------------------------------------------------------------------ No results found for: BNP  Time Spent in minutes 35   Dvid Pendry K M.D on 02/17/2016 at 12:25 PM  Between 7am to 7pm - Pager - [phone]  After 7pm go to [email or web address] - password [facility]  [facility] -  Office  [phone]

## 2016-02-17 NOTE — Progress Notes (Signed)
Pt run SVT 150's. EKG obtained, MD notified. See new orders. Will continue to monitor.

## 2016-02-17 NOTE — Progress Notes (Signed)
Patient family states that the patient is due her Pruleant dose this coming Wednesday if patient is still admitted.

## 2016-02-17 NOTE — Clinical Social Work Placement (Addendum)
   CLINICAL SOCIAL WORK PLACEMENT  NOTE  Date:  02/17/2016  Patient Details  Name: Jamie HatcherHelen Barber MRN: [id number] Date of Birth: 25-Nov-1932  Clinical Social Work is seeking post-discharge placement for this patient at the Skilled  Nursing Facility level of care (*CSW will initial, date and re-position this form in  chart as items are completed):  Yes   Patient/family provided with [facility] Clinical Social Work Department's list of facilities offering this level of care within the geographic area requested by the patient (or if unable, by the patient's family).  Yes   Patient/family informed of their freedom to choose among providers that offer the needed level of care, that participate in Medicare, Medicaid or managed care program needed by the patient, have an available bed and are willing to accept the patient.  Yes   Patient/family informed of [facility]'s ownership interest in [facility] and [facility], as well as of the fact that they are under no obligation to receive care at these facilities.  PASRR submitted to EDS on 02/17/16     PASRR number received on  02-19-16 (Updated 02/19/16 Windell MouldingEric Cuthbert Turton, MSW, LCSWA)     Existing PASRR number confirmed on       FL2 transmitted to all facilities in geographic area requested by pt/family on 02/17/16     FL2 transmitted to all facilities within larger geographic area on       Patient informed that his/her managed care company has contracts with or will negotiate with certain facilities, including the following:         02-19-16   Patient/family informed of bed offers received.  (Updated 02/19/16 Windell MouldingEric Finnlee Silvernail, MSW, LCSWA)    [facility]  [facility] (Updated 02/19/16 Windell MouldingEric Knox Cervi, MSW, [facility])       Physician recommends and patient chooses bed at    [facility]  02-19-16 (Updated 02/19/16 Windell MouldingEric Ryota Treece, MSW, LCSWA)  .  Patient to be transferred to facility by   [facility] (Updated 02/19/16 Windell MouldingEric  Shayana Hornstein, MSW, LCSWA)     Patient family notified on  02-19-16 of transfer. (Updated 02/19/16 Windell MouldingEric Jamoni Hewes, MSW, LCSWA)    Name of family member notified:   Elwin MochaRoy Stillings patient's husband (Updated 02/19/16 Windell MouldingEric Bailee Metter, MSW, LCSWA)       PHYSICIAN       Additional Comment:    _______________________________________________ Marnee SpringGerber, Holly Marie, LCSW 02/17/2016, 4:17 PM   Ervin KnackEric R. Chanee Henrickson, MSW, Theresia MajorsLCSWA [phone] 02/19/2016 2:18 PM
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
## 2016-02-17 NOTE — Transfer of Care (Signed)
Immediate Anesthesia Transfer of Care Note  Patient: Jamie HatcherHelen Kluge  Procedure(s) Performed: Procedure(s): ANTERIOR APPROACH HEMI HIP ARTHROPLASTY (Right)  Patient Location: PACU  Anesthesia Type:General  Level of Consciousness: sedated and patient cooperative  Airway & Oxygen Therapy: Patient Spontanous Breathing and Patient connected to face mask oxygen  Post-op Assessment: Report given to RN, Post -op Vital signs reviewed and stable and Patient moving all extremities  Post vital signs: Reviewed and stable  Last Vitals:  Filed Vitals:   02/17/16 0033 02/17/16 0431  BP: 154/77 139/61  Pulse: 104 113  Temp: 37.4 C 36.6 C  Resp: 18 18    Complications: No apparent anesthesia complications

## 2016-02-17 NOTE — Progress Notes (Signed)
Initial Nutrition Assessment  DOCUMENTATION CODES:   Not applicable  INTERVENTION:  Provide Ensure Enlive po BID, each supplement provides 350 kcal and 20 grams of protein.  Encourage adequate PO intake.  Recommend obtaining new weight to fully assess weight trends.  NUTRITION DIAGNOSIS:   Increased nutrient needs related to  (s/p surgery) as evidenced by estimated needs.  GOAL:   Patient will meet greater than or equal to 90% of their needs  MONITOR:   PO intake, Supplement acceptance, Weight trends, Labs, I & O's  REASON FOR ASSESSMENT:   Consult Assessment of nutrition requirement/status  ASSESSMENT:   82 y.o. female with a past medical history that includes advancing dementia, GERD, gait abnormality, stroke 1985 with no deficits, as as to the emergency department with the chief complaint of fall. Initial evaluation in the emergency department reveals right femoral neck fracture as well as hypertension.  PROCEDURE (3/4): Right hip hemiarthroplasty, anterior approach.   No current meal completion recorded. Pt was asleep during time of visit and would not wake. Family at bedside reports pt's appetite is been very varied at home where she would eat well one day and other day have poor po. She reports pt usually consumes Ensure on days where her intake is poor. Diet has been advanced to a heart diet. RD to order Ensure to aid in caloric and protein needs. Noted, no new weight recorded. Family reports usual body weight of ~160-170 lbs, however says she may have lost weight recently.   Pt with no observed significant fat or muscle mass loss.   Labs and medications reviewed.   Diet Order:  Diet Heart Room service appropriate?: Yes; Fluid consistency:: Thin  Skin:   (Incision on R hip)  Last BM:  3/2  Height:   Ht Readings from Last 1 Encounters:  10/04/15 5\' 7"  (1.702 m)    Weight:   Wt Readings from Last 1 Encounters:  10/04/15 171 lb 6.4 oz (77.747 kg)     Ideal Body Weight:  61.36 kg  BMI:  There is no weight on file to calculate BMI.  Estimated Nutritional Needs:   Kcal:  1800-1950  Protein:  80-90 grams  Fluid:  1.8 - 1.9 L/day  EDUCATION NEEDS:   No education needs identified at this time  Roslyn SmilingStephanie Santia Labate, MS, RD, LDN Pager # [phone] After hours/ weekend pager # [phone]

## 2016-02-17 NOTE — Anesthesia Postprocedure Evaluation (Signed)
Anesthesia Post Note  Patient: Jamie HatcherHelen Barber  Procedure(s) Performed: Procedure(s) (LRB): ANTERIOR APPROACH HEMI HIP ARTHROPLASTY (Right)  Patient location during evaluation: PACU Anesthesia Type: General Level of consciousness: awake and alert Pain management: pain level controlled Vital Signs Assessment: post-procedure vital signs reviewed and stable Respiratory status: spontaneous breathing, nonlabored ventilation, respiratory function stable and patient connected to nasal cannula oxygen Cardiovascular status: blood pressure returned to baseline and stable Postop Assessment: no signs of nausea or vomiting Anesthetic complications: no    Last Vitals:  Filed Vitals:   02/17/16 1347 02/17/16 1402  BP: 110/58 126/58  Pulse: 74 77  Temp:    Resp: 16 16    Last Pain:  Filed Vitals:   02/17/16 1422  PainSc: Asleep                 Kennieth RadFitzgerald, Arjay Jaskiewicz E

## 2016-02-17 NOTE — Clinical Social Work Note (Signed)
Clinical Social Work Assessment  Patient Details  Name: [name] MRN: [id number] Date of Birth: 1932/06/23  Date of referral:  02/17/16               Reason for consult:  Discharge Planning                Permission sought to share information with:  Family Supports Permission granted to share information::  Yes, Verbal Permission Granted  Name::     Pam  Agency::     Relationship::  dtr-in-law  Contact Information:     Housing/Transportation Living arrangements for the past 2 months:  Single Family Home Source of Information:  Other (Comment Required) (Dtr-in-law) Patient Interpreter Needed:  None Criminal Activity/Legal Involvement Pertinent to Current Situation/Hospitalization:  No - Comment as needed Significant Relationships:  Adult Children, Spouse Lives with:  Spouse Do you feel safe going back to the place where you live?  No Need for family participation in patient care:  Yes (Comment)  Care giving concerns:  Family requesting SNF due to behaviors at home and recent fall.   Social Worker assessment / plan:  CSW received call from RN that family was present and wanted to speak with CSW. CSW met with dtr-in-law in room while pt slept. Family reports that pt lives at home with husband. Pt had hip surgery in the past and returned home after hospitalization but dementia has progressed since then. Family is interested in SNF and understanding of possible co-pays. Family is interested in [facility] or [facility] CSW provided SNF list and encouraged family to have alternative options in case their top facilities were unable to accept pt. Family agreeable to [facility] and [facility] search. CSW completed FL2 and faxed out.   Employment status:  Retired Nurse, adult PT Recommendations:  Not assessed at this time Information / Referral to community resources:  [place]  Patient/Family's Response to care:  Pt slept during  assessment. Dtr-in-law engaged and asked appropriate questions.  Patient/Family's Understanding of and Emotional Response to Diagnosis, Current Treatment, and Prognosis:  Dtr-in-law is worried about pt and her husband's health. Pt is an elopement risk and can be combative with family. Family has hired sitters that are attempting to help control pt's behaviors and PCP has been prescribing medications to assist as well. Family is interested in [place] SNF until hip has healed properly.   Emotional Assessment Appearance:  Appears stated age Attitude/Demeanor/Rapport:  Unable to Assess Affect (typically observed):  Unable to Assess Orientation:  Oriented to Self Alcohol / Substance use:  Not Applicable Psych involvement (Current and /or in the community):  No (Comment)  Discharge Needs  Concerns to be addressed:  No discharge needs identified Readmission within the last 30 days:  No Current discharge risk:  None Barriers to Discharge:  No Barriers Identified   Boone Master, [place] 02/17/2016, 4:14 PM Weekend Coverage [phone]

## 2016-02-18 DIAGNOSIS — S72001A Fracture of unspecified part of neck of right femur, initial encounter for closed fracture: Secondary | ICD-10-CM

## 2016-02-18 LAB — BASIC METABOLIC PANEL
Anion gap: 11 (ref 5–15)
BUN: 13 mg/dL (ref 6–20)
CHLORIDE: 104 mmol/L (ref 101–111)
CO2: 24 mmol/L (ref 22–32)
CREATININE: 0.73 mg/dL (ref 0.44–1.00)
Calcium: 8.4 mg/dL — ABNORMAL LOW (ref 8.9–10.3)
GFR calc Af Amer: >60 mL/min (ref >60)
GFR calc non Af Amer: >60 mL/min (ref >60)
GLUCOSE: 114 mg/dL — AB (ref 65–99)
Potassium: 3.8 mmol/L (ref 3.5–5.1)
Sodium: 139 mmol/L (ref 135–145)

## 2016-02-18 LAB — CBC
HEMATOCRIT: 35 % — AB (ref 36.0–46.0)
HEMOGLOBIN: 11.3 g/dL — AB (ref 12.0–15.0)
MCH: 29.6 pg (ref 26.0–34.0)
MCHC: 32.3 g/dL (ref 30.0–36.0)
MCV: 91.6 fL (ref 78.0–100.0)
Platelets: 210 10*3/uL (ref 150–400)
RBC: 3.82 MIL/uL — ABNORMAL LOW (ref 3.87–5.11)
RDW: 13 % (ref 11.5–15.5)
WBC: 10 10*3/uL (ref 4.0–10.5)

## 2016-02-18 LAB — T3: T3, Total: 75 ng/dL (ref 71–180)

## 2016-02-18 MED ORDER — ASPIRIN EC 81 MG PO TBEC
81.0000 mg | DELAYED_RELEASE_TABLET | Freq: Two times a day (BID) | ORAL | Status: DC
  Administered 2016-02-18 – 2016-02-19 (×3): 81 mg via ORAL
  Filled 2016-02-18 (×3): qty 1

## 2016-02-18 NOTE — Progress Notes (Signed)
Subjective: 1 Day Post-Op Procedure(s) (LRB): ANTERIOR APPROACH HEMI HIP ARTHROPLASTY (Right) Patient reports pain as well controlled.  Reports a good night. Denies Sob, CP, calf pain. Family at bedside.  Objective: Vital signs in last 24 hours: Temp:  [99.1 F (37.3 C)] 99.1 F (37.3 C) (03/04 1948) Pulse Rate:  [70-119] 89 (03/05 0933) Resp:  [16-17] 17 (03/04 1948) BP: (110-166)/(54-85) 134/58 mmHg (03/05 0933) SpO2:  [94 %-97 %] 96 % (03/04 1948)  Intake/Output from previous day: 03/04 0701 - 03/05 0700 In: 1050 [P.O.:50; I.V.:1000] Out: 1475 [Urine:1275; Blood:200] Intake/Output this shift:     Recent Labs  02/16/16 0859 02/17/16 0530 02/18/16 0431  HGB 13.0 12.5 11.3*    Recent Labs  02/17/16 0530 02/18/16 0431  WBC 7.7 10.0  RBC 4.19 3.82*  HCT 37.7 35.0*  PLT 228 210    Recent Labs  02/17/16 0530 02/18/16 0431  NA 141 139  K 3.7 3.8  CL 107 104  CO2 23 24  BUN 17 13  CREATININE 0.80 0.73  GLUCOSE 119* 114*  CALCIUM 8.9 8.4*    Recent Labs  02/16/16 1819  INR 1.07    Well nourished. Alert and oriented x3. RRR, Lungs clear, BS x4. Abdomen soft and non tender. Right Calf soft and non tender. Right hip dressing C/D/I. No DVT signs. Compartment soft. No signs of infection.  Right LE grossly neurovascular intact.  Assessment/Plan: 1 Day Post-Op Procedure(s) (LRB): ANTERIOR APPROACH HEMI HIP ARTHROPLASTY (Right) ASA 81mg  BID Up with PT WBAT with walker Continue current care Consider SNF at D/c  STILWELL, BRYSON L 02/18/2016, 10:44 AM

## 2016-02-18 NOTE — Evaluation (Signed)
Clinical/Bedside Swallow Evaluation Patient Details  Name: [name] MRN: [id number] Date of Birth: 04-10-1932  Today's Date: 02/18/2016 Time: SLP Start Time (ACUTE ONLY): 1100 SLP Stop Time (ACUTE ONLY): 1115 SLP Time Calculation (min) (ACUTE ONLY): 15 min  Past Medical History:  Past Medical History  Diagnosis Date  . Anxiety   . GERD (gastroesophageal reflux disease)   . Insomnia   . Memory loss   . Lumbago   . dementia   . Stroke [facility](HCC) 1985    no deficits   Past Surgical History:  Past Surgical History  Procedure Laterality Date  . Tonsillectomy    . Adenoidectomy    . Cholecystectomy    . Total vaginal hysterectomy    . Appendectomy    . Abdominal hysterectomy    . Hip arthroplasty Left 02/07/2014    Procedure: ARTHROPLASTY BIPOLAR HIP;  Surgeon: Shelda PalMatthew D Olin, MD;  [facility];  Service: Orthopedics;  Laterality: Left;   HPI:  [name] is a 82 y.o. female with a past medical history that includes advancing dementia, GERD, gait abnormality, stroke 1985 with no deficits, as as to the emergency department with the chief complaint of fall. Initial evaluation in the emergency department reveals right femoral neck fracture as well as hypertension.  She is status post surgical repair.     Assessment / Plan / Recommendation Clinical Impression  Clinical swallowing evaluation was completed.  The patient's oral and pharyngeal swallow appeared to be functional.  Swallow trigger was timely and hyo-laryngeal excursion was judged to be adequate.  Mastication of dry solids was functional.  Overt s/s of aspiration were not observed.  Recommend a regular diet and thin liquids.  Due to the patient's dementia and confusion ST will follow up for therapeutic diet tolerance.     Aspiration Risk  Mild aspiration risk    Diet Recommendation   Regular and thin liquids.  Medication Administration: Whole meds with liquid    Other  Recommendations Oral Care Recommendations: Oral care  BID   Follow up Recommendations   (TBD)    Frequency and Duration min 2x/week  2 weeks       Prognosis Prognosis for Safe Diet Advancement: Good Barriers to Reach Goals: Cognitive deficits      Swallow Study   General Date of Onset: 02/16/16 HPI: [name] is a 82 y.o. female with a past medical history that includes advancing dementia, GERD, gait abnormality, stroke 1985 with no deficits, as as to the emergency department with the chief complaint of fall. Initial evaluation in the emergency department reveals right femoral neck fracture as well as hypertension.  She is status post surgical repair.   Type of Study: Bedside Swallow Evaluation Previous Swallow Assessment: None noted.   Diet Prior to this Study: Regular;Thin liquids Temperature Spikes Noted: Yes Respiratory Status: Room air History of Recent Intubation: Yes Length of Intubations (days): 1 days (Intubated for surgery only) Date extubated: 02/17/16 Behavior/Cognition: Alert;Cooperative;Requires cueing;Distractible Oral Cavity Assessment: Within Functional Limits Oral Care Completed by SLP: No Oral Cavity - Dentition: Adequate natural dentition Vision: Functional for self-feeding Self-Feeding Abilities: Able to feed self Patient Positioning: Upright in chair Baseline Vocal Quality: Low vocal intensity Volitional Cough: Strong Volitional Swallow: Able to elicit    Oral/Motor/Sensory Function Overall Oral Motor/Sensory Function: Within functional limits   Ice Chips Ice chips: Not tested   Thin Liquid Thin Liquid: Within functional limits Presentation: Cup;Spoon;Straw;Self Fed    Nectar Thick Nectar Thick Liquid: Not tested  Honey Thick Honey Thick Liquid: Not tested   Puree Puree: Within functional limits Presentation: Spoon   Solid   GO   Solid: Within functional limits Presentation: Spoon    Functional Assessment Tool Used: ASHA NOMS and clinical judgment.   Functional Limitations:  Swallowing Swallow Current Status [phone]): At least 1 percent but less than 20 percent impaired, limited or restricted Swallow Goal Status [phone]): 0 percent impaired, limited or restricted  Dimas Aguas, MA, CCC-SLP Acute Rehab SLP [phone]  Dimas Aguas N 02/18/2016,12:50 PM

## 2016-02-18 NOTE — Progress Notes (Signed)
Patient Demographics:    [name], is a 82 y.o. female, DOB - 06-19-1932, ZOX:[id number]  Admit date - 02/16/2016   Admitting Physician Ozella Rocks, MD  Outpatient Primary MD for the patient is [name], MD  LOS - 2   Chief Complaint  Patient presents with  . Fall        Subjective:    [name] today has, No headache, No chest pain, No abdominal pain - No Nausea, No new weakness tingling or numbness, No Cough - SOB.     Assessment  & Plan :     1.Mechanical fall at home with right femoral neck fracture. Status post right hip hemiarthroplasty on 02/17/2016 by Dr. Lisette Grinder, patient seems to have tolerated the procedure well, aspirin 325 twice a day per orthopedics for DVT prophylaxis, weightbearing as tolerated, PT evaluate will likely require SNF. Minimal perioperative blood loss related anemia not requiring transfusion will continue to monitor.  2. Underlying dementia. At risk for delirium, continue home dose Aricept, memantine and Seroquel. Minimize narcotics and benzodiazepines.  3. Hypertension. Added Norvasc, as needed IV hydralazine and Lopressor. Pain control.  4. Osteoporosis. Continue vitamin D supplementation along with raloxifene.  5. Brief Run of SVT at 4pm on 02/17/2016 - EKG, TSH, PO lopressor scheduled and IV PRN, now in NSR, Tele.  6. Mild underlying dysphagia. Soft diet, feeding assistance and aspiration precautions, speech to follow.    Code Status : DO NOT RESUSCITATE  Family Communication  : Daughter-in-lawAnd husband bedside  Disposition Plan  : SNF  Consults  :  Orthopedics  Procedures  :  Right hip open reduction internal fixation with hemiarthroplasty with anterior approach On 02/17/2016  DVT Prophylaxis  :  Aspirin 325 twice a day per  orthopedics  Lab Results  Component Value Date   PLT 210 02/18/2016    Inpatient Medications  Scheduled Meds: . aspirin EC  81 mg Oral BID PC  . donepezil  10 mg Oral QHS  . feeding supplement (ENSURE ENLIVE)  237 mL Oral BID BM  . LORazepam  0.5 mg Oral QHS  . memantine  28 mg Oral Daily  . metoprolol tartrate  50 mg Oral BID  . QUEtiapine  50 mg Oral QHS  . raloxifene  60 mg Oral Daily  . trihexyphenidyl  2 mg Oral BID WC   Continuous Infusions:   PRN Meds:.acetaminophen **OR** [DISCONTINUED] acetaminophen, hydrALAZINE, HYDROcodone-acetaminophen, metoprolol, naLOXone (NARCAN)  injection, [DISCONTINUED] ondansetron **OR** ondansetron (ZOFRAN) IV  Antibiotics  :    Anti-infectives    Start     Dose/Rate Route Frequency Ordered Stop   02/17/16 1800  ceFAZolin (ANCEF) IVPB 2 g/50 mL premix     2 g 100 mL/hr over 30 Minutes Intravenous Every 6 hours 02/17/16 1745 02/18/16 0030   02/17/16 1100  ceFAZolin (ANCEF) IVPB 2 g/50 mL premix  Status:  Discontinued     2 g 100 mL/hr over 30 Minutes Intravenous Every 6 hours 02/17/16 1048 02/17/16 1745   02/17/16 0715  ceFAZolin (ANCEF) IVPB 2 g/50 mL premix     2 g 100 mL/hr over 30 Minutes Intravenous To Surgery 02/17/16 0708 02/17/16 0800        Objective:   Filed Vitals:  02/17/16 1658 02/17/16 1700 02/17/16 1948 02/18/16 0933  BP: 141/67  135/54 134/58  Pulse: 107 119 70 89  Temp:   99.1 F (37.3 C)   TempSrc:   Oral   Resp:   17   SpO2:   96%     Wt Readings from Last 3 Encounters:  10/04/15 77.747 kg (171 lb 6.4 oz)  04/03/15 78.382 kg (172 lb 12.8 oz)  10/12/14 80.287 kg (177 lb)     Intake/Output Summary (Last 24 hours) at 02/18/16 0948 Last data filed at 02/17/16 2156  Gross per 24 hour  Intake     50 ml  Output   1075 ml  Net  -1025 ml     Physical Exam  Awake, Pleasantly confused, No new F.N deficits, Normal affect Leonardo.AT,PERRAL Supple Neck,No JVD, No cervical lymphadenopathy appriciated.   Symmetrical Chest wall movement, Good air movement bilaterally, CTAB RRR,No Gallops,Rubs or new Murmurs, No Parasternal Heave +ve B.Sounds, Abd Soft, No tenderness, No organomegaly appriciated, No rebound - guarding or rigidity. No Cyanosis, Clubbing or edema, No new Rash or bruise , right hip postop site appears stable    Data Review:   Micro Results Recent Results (from the past 240 hour(s))  Surgical pcr screen     Status: None   Collection Time: 02/16/16  8:33 PM  Result Value Ref Range Status   MRSA, PCR NEGATIVE NEGATIVE Final   Staphylococcus aureus NEGATIVE NEGATIVE Final    Comment:        The Xpert SA Assay (FDA approved for NASAL specimens in patients over 55 years of age), is one component of a comprehensive surveillance program.  Test performance has been validated by [facility] for patients greater than or equal to 68 year old. It is not intended to diagnose infection nor to guide or monitor treatment.     Radiology Reports Dg Chest 1 View  02/16/2016  CLINICAL DATA:  Fall today, right hip pain EXAM: CHEST 1 VIEW COMPARISON:  02/06/2014 FINDINGS: Cardiomediastinal silhouette is stable. Mild elevation of the right hemidiaphragm. No infiltrate or pulmonary edema. Atherosclerotic calcifications of thoracic aorta. There is no pneumothorax. IMPRESSION: No infiltrate or pulmonary edema. Mild elevation of the right hemidiaphragm again noted. No pneumothorax. Electronically Signed   By: Natasha Mead M.D.   On: 02/16/2016 09:23   Ct Head Wo Contrast  02/16/2016  CLINICAL DATA:  Status post fall earlier today EXAM: CT HEAD WITHOUT CONTRAST CT CERVICAL SPINE WITHOUT CONTRAST TECHNIQUE: Multidetector CT imaging of the head and cervical spine was performed following the standard protocol without intravenous contrast. Multiplanar CT image reconstructions of the cervical spine were also generated. COMPARISON:  Head CT February 09, 2016 FINDINGS: CT HEAD FINDINGS Moderate diffuse  atrophy is stable. There is no intracranial mass, hemorrhage, extra-axial fluid collection, or midline shift. Patchy small vessel disease in the centra semiovale bilaterally is stable. There is no new gray-white compartment lesion. No acute infarct evident. Basal ganglia calcification is noted bilaterally. Bony calvarium appears intact. The mastoid air cells are clear. No intraorbital lesions are apparent. CT CERVICAL SPINE FINDINGS There is no fracture or spondylolisthesis. Prevertebral soft tissues and predental space regions are normal. There is moderate disc space narrowing at C4-5, C5-6, and C6-7. There is facet hypertrophy at multiple levels bilaterally. There is exit foraminal narrowing at multiple levels, most notably at C3-4 on the left, C4-5 on the right, C5-6 on the right, and C6-7 bilaterally. IMPRESSION: CT head: Moderate generalized atrophy with patchy  periventricular small vessel disease. No acute infarct evident. No hemorrhage, mass, or extra-axial fluid collection. CT cervical spine: Multilevel arthropathy. No demonstrable fracture or spondylolisthesis. Electronically Signed   By: Bretta BangWilliam  Woodruff III M.D.   On: 02/16/2016 09:42   Ct Head Wo Contrast  02/09/2016  CLINICAL DATA:  Confusion.  History of dementia EXAM: CT HEAD WITHOUT CONTRAST TECHNIQUE: Contiguous axial images were obtained from the base of the skull through the vertex without intravenous contrast. COMPARISON:  January 17, 2011 FINDINGS: Moderate diffuse atrophy is stable. There is no intracranial mass, hemorrhage, extra-axial fluid collection, or midline shift. There is patchy small vessel disease in the centra semiovale bilaterally. Elsewhere, gray-white compartments appear normal. No acute infarct is evident. There are basal ganglia calcifications bilaterally, a stable finding. The bony calvarium appears intact. The mastoid air cells are clear. No intraorbital lesions are identified. IMPRESSION: Moderate atrophy with patchy  small vessel disease in the centra semiovale bilaterally. No intracranial mass, hemorrhage, or acute appearing infarct. Basal ganglia calcification is likely physiologic in this age group, stable. Electronically Signed   By: Bretta BangWilliam  Woodruff III M.D.   On: 02/09/2016 13:36   Ct Cervical Spine Wo Contrast  02/16/2016  CLINICAL DATA:  Status post fall earlier today EXAM: CT HEAD WITHOUT CONTRAST CT CERVICAL SPINE WITHOUT CONTRAST TECHNIQUE: Multidetector CT imaging of the head and cervical spine was performed following the standard protocol without intravenous contrast. Multiplanar CT image reconstructions of the cervical spine were also generated. COMPARISON:  Head CT February 09, 2016 FINDINGS: CT HEAD FINDINGS Moderate diffuse atrophy is stable. There is no intracranial mass, hemorrhage, extra-axial fluid collection, or midline shift. Patchy small vessel disease in the centra semiovale bilaterally is stable. There is no new gray-white compartment lesion. No acute infarct evident. Basal ganglia calcification is noted bilaterally. Bony calvarium appears intact. The mastoid air cells are clear. No intraorbital lesions are apparent. CT CERVICAL SPINE FINDINGS There is no fracture or spondylolisthesis. Prevertebral soft tissues and predental space regions are normal. There is moderate disc space narrowing at C4-5, C5-6, and C6-7. There is facet hypertrophy at multiple levels bilaterally. There is exit foraminal narrowing at multiple levels, most notably at C3-4 on the left, C4-5 on the right, C5-6 on the right, and C6-7 bilaterally. IMPRESSION: CT head: Moderate generalized atrophy with patchy periventricular small vessel disease. No acute infarct evident. No hemorrhage, mass, or extra-axial fluid collection. CT cervical spine: Multilevel arthropathy. No demonstrable fracture or spondylolisthesis. Electronically Signed   By: Bretta BangWilliam  Woodruff III M.D.   On: 02/16/2016 09:42   Pelvis Portable  02/17/2016  CLINICAL  DATA:  Anterior approach right hemiarthroplasty right hip EXAM: PORTABLE PELVIS 1-2 VIEWS COMPARISON:  03/04/2014 FINDINGS: Stable left hip arthroplasty. Pelvic bones intact. Patient now status post right hip arthroplasty with anticipated soft tissue change over the right hip joint postoperatively. IMPRESSION: Anticipated postoperative appearance Electronically Signed   By: Esperanza Heiraymond  Rubner M.D.   On: 02/17/2016 10:21   Dg Hip Operative Unilat With Pelvis Right  02/17/2016  CLINICAL DATA:  Right hip replacement. EXAM: OPERATIVE right HIP (WITH PELVIS IF PERFORMED)  VIEWS TECHNIQUE: Fluoroscopic spot image(s) were submitted for interpretation post-operatively. COMPARISON:  February 16, 2016 FLUOROSCOPY TIME:  6 seconds. FINDINGS: Three images were obtained during a right hip replacement. By the end of the study, acetabular and femoral components are in good position. IMPRESSION: Right hip replacement as described above. Electronically Signed   By: Gerome Samavid  Williams III M.D   On: 02/17/2016 09:20  Dg Hip Unilat With Pelvis 2-3 Views Right  02/16/2016  CLINICAL DATA:  Fall today, right hip pain EXAM: DG HIP (WITH OR WITHOUT PELVIS) 2-3V RIGHT COMPARISON:  None. FINDINGS: Three views of the right hip submitted. There is mild displaced fracture of the right femoral neck. Diffuse osteopenia. Left hip prosthesis partially visualized. There is anatomic alignment of left hip prosthesis. IMPRESSION: Mild displaced fracture of the right femoral neck. Diffuse osteopenia. Partially visualized left hip prosthesis with anatomic alignment. Electronically Signed   By: Natasha Mead M.D.   On: 02/16/2016 09:22     CBC  Recent Labs Lab 02/16/16 0859 02/17/16 0530 02/18/16 0431  WBC 12.4* 7.7 10.0  HGB 13.0 12.5 11.3*  HCT 39.3 37.7 35.0*  PLT 247 228 210  MCV 90.3 90.0 91.6  MCH 29.9 29.8 29.6  MCHC 33.1 33.2 32.3  RDW 12.8 13.1 13.0  LYMPHSABS 0.7  --   --   MONOABS 0.5  --   --   EOSABS 0.0  --   --   BASOSABS 0.0   --   --     Chemistries   Recent Labs Lab 02/16/16 0859 02/17/16 0530 02/18/16 0431  NA 142 141 139  K 3.7 3.7 3.8  CL 104 107 104  CO2 GLUCOSE 121* 119* 114*  BUN CREATININE 0.76 0.80 0.73  CALCIUM 9.3 8.9 8.4*   ------------------------------------------------------------------------------------------------------------------ No results for input(s): CHOL, HDL, LDLCALC, TRIG, CHOLHDL, LDLDIRECT in the last 72 hours.  No results found for: HGBA1C ------------------------------------------------------------------------------------------------------------------  Recent Labs  02/17/16 1800  TSH 0.745   ------------------------------------------------------------------------------------------------------------------ No results for input(s): VITAMINB12, FOLATE, FERRITIN, TIBC, IRON, RETICCTPCT in the last 72 hours.  Coagulation profile  Recent Labs Lab 02/16/16 1819  INR 1.07    No results for input(s): DDIMER in the last 72 hours.  Cardiac Enzymes  Recent Labs Lab 02/16/16 1149 02/16/16 1819 02/16/16 2249  TROPONINI <0.03 <0.03 <0.03   ------------------------------------------------------------------------------------------------------------------ No results found for: BNP  Time Spent in minutes 35   Nicolae Vasek K M.D on 02/18/2016 at 9:48 AM  Between 7am to 7pm - Pager - [phone]  After 7pm go to [email or web address] - password [facility]  [facility] -  Office  [phone]

## 2016-02-18 NOTE — Evaluation (Signed)
Physical Therapy Evaluation Patient Details Name: [name] MRN: [id number] DOB: October 02, 1932 Today's Date: 02/18/2016   History of Present Illness  82 yo admitted after fall at home with right femur fx s/p anterior right hip hemiarthoplasty. PMHx: L hip hemiarthroplasty, dementia, GERD, CVA  Clinical Impression  Pt pleasant with flat affect unable to state name, place or situation. Spouse present to provide PLOF. Pt able to follow simple commands grossly 50% of the time. Pt without statement of pain or obvious discomfort with movement. Pt with decreased strength, function, transfers and gait who will benefit from acute therapy to maximize mobility, strength and function to decrease burden of care.     Follow Up Recommendations SNF;Supervision/Assistance - 24 hour    Equipment Recommendations  None recommended by PT    Recommendations for Other Services       Precautions / Restrictions Precautions Precautions: Fall Restrictions Weight Bearing Restrictions: Yes RLE Weight Bearing: Weight bearing as tolerated      Mobility  Bed Mobility Overal bed mobility: Needs Assistance Bed Mobility: Supine to Sit     Supine to sit: Mod assist;+2 for physical assistance;HOB elevated     General bed mobility comments: cues for sequence with use of pad for helicopter pivot to EOB and mod assist to scoot fully to edge. Min assist for sitting balance  Transfers Overall transfer level: Needs assistance   Transfers: Sit to/from Stand;Stand Pivot Transfers Sit to Stand: Max assist;+2 physical assistance Stand pivot transfers: Max assist;+2 physical assistance       General transfer comment: cues for sequence with assist for hand placement and right knee blocked with pt able to stand from bed x 2 with support of therapist arms and pivot to chair with additional stand from chair for pericare assist  Ambulation/Gait                Stairs            Wheelchair Mobility     Modified Rankin (Stroke Patients Only)       Balance Overall balance assessment: Needs assistance;History of Falls Sitting-balance support: Bilateral upper extremity supported;Feet supported Sitting balance-Leahy Scale: Poor     Standing balance support: Bilateral upper extremity supported Standing balance-Leahy Scale: Zero                               Pertinent Vitals/Pain Pain Assessment: No/denies pain    Home Living Family/patient expects to be discharged to:: Skilled nursing facility Living Arrangements: Spouse/significant other Available Help at Discharge: Family;Available 24 hours/day Type of Home: House       Home Layout: One level Home Equipment: Walker - 2 wheels;Cane - single point;Bedside commode;Shower seat;Wheelchair - manual      Prior Function Level of Independence: Needs assistance   Gait / Transfers Assistance Needed: walking in home with supervision and cane at times but would frequently forget cane  ADL's / Homemaking Assistance Needed: spouse assists for transfers into and out of shower, pt sits and bathes herself and spouse assists with some dressing. Pt does not do homemaking        Hand Dominance        Extremity/Trunk Assessment   Upper Extremity Assessment: Generalized weakness           Lower Extremity Assessment: Generalized weakness      Cervical / Trunk Assessment: Kyphotic  Communication   Communication: No difficulties  Cognition Arousal/Alertness: Awake/alert Behavior During  Therapy: Flat affect Overall Cognitive Status: History of cognitive impairments - at baseline                      General Comments      Exercises        Assessment/Plan    PT Assessment Patient needs continued PT services  PT Diagnosis Difficulty walking;Generalized weakness;Altered mental status   PT Problem List Decreased strength;Decreased cognition;Decreased activity tolerance;Decreased balance;Decreased  mobility;Decreased knowledge of use of DME  PT Treatment Interventions DME instruction;Gait training;Functional mobility training;Therapeutic activities;Therapeutic exercise;Balance training;Patient/family education   PT Goals (Current goals can be found in the Care Plan section) Acute Rehab PT Goals Patient Stated Goal: spouse would like her to get therapy and be stronger for return home after SNF PT Goal Formulation: With family Time For Goal Achievement: 03/03/16 Potential to Achieve Goals: Fair    Frequency Min 3X/week   Barriers to discharge Decreased caregiver support      Co-evaluation               End of Session Equipment Utilized During Treatment: Gait belt Activity Tolerance: Patient tolerated treatment well Patient left: in chair;with call bell/phone within reach;with chair alarm set;with nursing/sitter in room;with family/visitor present Nurse Communication: Mobility status;Precautions;Weight bearing status         Time: [id number] PT Time Calculation (min) (ACUTE ONLY): 16 min   Charges:   PT Evaluation $PT Eval Moderate Complexity: 1 Procedure     PT G CodesDelorse Lek:        Tabor, Maricella Filyaw Beth 02/18/2016, 10:26 AM Delaney MeigsMaija Tabor Nico Rogness, PT [phone]

## 2016-02-18 NOTE — Progress Notes (Signed)
OT Cancellation Note  Patient Details Name: Jamie HatcherHelen Beers MRN: [id number] DOB: [birth year]   Cancelled Treatment:    Reason Eval/Treat Not Completed:  (OT screened) Pt's current D/C plan is SNF. No apparent immediate acute care OT needs, therefore will defer OT to SNF. If OT eval is needed please call Acute Rehab Dept. at [phone] or text page OT at [phone].    Earlie RavelingStraub, Alexea Blase L OTR/L [phone] 02/18/2016, 1:28 PM

## 2016-02-19 ENCOUNTER — Encounter
Admission: RE | Admit: 2016-02-19 | Discharge: 2016-02-19 | Disposition: A | Discharge status: Home / Self Care | Payer: Medicare Other | Source: Ambulatory Visit | Attending: Internal Medicine | Admitting: Internal Medicine

## 2016-02-19 ENCOUNTER — Inpatient Hospital Stay (HOSPITAL_COMMUNITY): Payer: Medicare Other

## 2016-02-19 ENCOUNTER — Encounter (HOSPITAL_COMMUNITY): Payer: Self-pay | Admitting: Orthopedic Surgery

## 2016-02-19 DIAGNOSIS — D649 Anemia, unspecified: Secondary | ICD-10-CM | POA: Insufficient documentation

## 2016-02-19 LAB — HEMOGLOBIN AND HEMATOCRIT, BLOOD
HCT: 31.9 % — ABNORMAL LOW (ref 36.0–46.0)
HEMOGLOBIN: 10.6 g/dL — AB (ref 12.0–15.0)

## 2016-02-19 MED ORDER — METOPROLOL TARTRATE 100 MG PO TABS: 100.0000 mg | ORAL_TABLET | Freq: Two times a day (BID) | ORAL | Status: AC

## 2016-02-19 MED ORDER — TAMSULOSIN HCL 0.4 MG PO CAPS: 0.4000 mg | ORAL_CAPSULE | Freq: Every day | ORAL | Status: AC

## 2016-02-19 MED ORDER — TAMSULOSIN HCL 0.4 MG PO CAPS: 0.4000 mg | ORAL_CAPSULE | Freq: Every day | ORAL | Status: DC

## 2016-02-19 MED ORDER — PANTOPRAZOLE SODIUM 40 MG PO TBEC: 40.0000 mg | DELAYED_RELEASE_TABLET | Freq: Every day | ORAL | Status: AC

## 2016-02-19 MED ORDER — METOPROLOL TARTRATE 100 MG PO TABS
100.0000 mg | ORAL_TABLET | Freq: Two times a day (BID) | ORAL | Status: DC
  Administered 2016-02-19: 100 mg via ORAL

## 2016-02-19 MED ORDER — HYDROCODONE-ACETAMINOPHEN 5-325 MG PO TABS: 1.0000 | ORAL_TABLET | Freq: Four times a day (QID) | ORAL | Status: AC | PRN

## 2016-02-19 MED ORDER — ASPIRIN 81 MG PO TBEC
81.0000 mg | DELAYED_RELEASE_TABLET | Freq: Two times a day (BID) | ORAL | Status: AC
Start: 2016-02-19 — End: ?

## 2016-02-19 NOTE — Progress Notes (Signed)
   Subjective:  Patient reports pain as mild to moderate.  No c/o.  Objective:   VITALS:   Filed Vitals:   02/18/16 1341 02/18/16 2048 02/18/16 2053 02/19/16 0617  BP: 120/56 160/63 138/57 145/51  Pulse: 87 103  114  Temp: 98.7 F (37.1 C) 99 F (37.2 C)  99.7 F (37.6 C)  TempSrc: Oral Oral  Oral  Resp: 16 16  18   SpO2: 96% 95%  93%    ABD soft Sensation intact distally Intact pulses distally Dorsiflexion/Plantar flexion intact Incision: dressing C/D/I Compartment soft   Lab Results  Component Value Date   WBC 10.0 02/18/2016   HGB 10.6* 02/19/2016   HCT 31.9* 02/19/2016   MCV 91.6 02/18/2016   PLT 210 02/18/2016   BMET    Component Value Date/Time   NA 139 02/18/2016 0431   K 3.8 02/18/2016 0431   CL 104 02/18/2016 0431   CO2 24 02/18/2016 0431   GLUCOSE 114* 02/18/2016 0431   BUN 13 02/18/2016 0431   CREATININE 0.73 02/18/2016 0431   CALCIUM 8.4* 02/18/2016 0431   GFRNONAA >60 02/18/2016 0431   GFRAA >60 02/18/2016 0431     Assessment/Plan: 2 Days Post-Op   Principal Problem:   Femoral neck fracture (HCC) Active Problems:   GERD (gastroesophageal reflux disease)   Dementia with behavioral disturbance   HTN (hypertension)   Fall   Displaced fracture of right femoral neck (HCC)   WBAT with walker DVT ppx: ASA 81 mg PO BID x30 days PT/OT PO pain control Dispo: D/C planning   Zarian Colpitts, Cloyde ReamsBrian James 02/19/2016, 7:36 AM   Samson FredericBrian Elli Groesbeck, MD Cell [phone]

## 2016-02-19 NOTE — Clinical Social Work Note (Signed)
CSW spoke to patient and her family to present bed offers.  [facility] and [facility].  [facility] and [facility] confirmed [facility] can [facility] patient today.  CSW faxed requested information to [facility] to be assigned a [facility] number.  CSW awaiting response from [facility].  Ervin KnackEric R. Ayala Ribble, MSW, Theresia MajorsLCSWA [phone] 02/19/2016 12:17 PM

## 2016-02-19 NOTE — Care Management Important Message (Signed)
Important Message  Patient Details  Name: Jamie HatcherHelen Carbon MRN: [id number] Date of Birth: 12/07/1932   Medicare Important Message Given:  Yes    Crispin Vogel P Carol Loftin 02/19/2016, 3:10 PM

## 2016-02-19 NOTE — Progress Notes (Signed)
Jamie HatcherHelen Barber discharged SNF per MD order. All questions and concerns answered. IV removed. Discharged with [facility] per order from Thedore MinsSingh, MD. Report given to SNF. Patient escorted PTAR. No distress noted upon discharge.   Dennard NipScott, Ceaser Ebeling R 02/19/2016 4:04 PM

## 2016-02-19 NOTE — Discharge Instructions (Signed)
°Dr. Brian Swinteck °Joint Replacement Specialist °[facility] °[address] °[place],  [address] °[phone] ° ° °TOTAL HIP REPLACEMENT POSTOPERATIVE DIRECTIONS ° ° ° °Hip Rehabilitation, Guidelines Following Surgery  ° °WEIGHT BEARING °Weight bearing as tolerated with assist device (walker, cane, etc) as directed, use it as long as suggested by your surgeon or therapist, typically at least 4-6 weeks. ° °The results of a hip operation are greatly improved after range of motion and muscle strengthening exercises. Follow all safety measures which are given to protect your hip. If any of these exercises cause increased pain or swelling in your joint, decrease the amount until you are comfortable again. Then slowly increase the exercises. Call your caregiver if you have problems or questions.  ° °HOME CARE INSTRUCTIONS  °Most of the following instructions are designed to prevent the dislocation of your new hip.  °Remove items at home which could result in a fall. This includes throw rugs or furniture in walking pathways.  °Continue medications as instructed at time of discharge. °· You may have some home medications which will be placed on hold until you complete the course of blood thinner medication. °· You may start showering once you are discharged home. Do not remove your dressing. °Do not put on socks or shoes without following the instructions of your caregivers.   °Sit on chairs with arms. Use the chair arms to help push yourself up when arising.  °Arrange for the use of a toilet seat elevator so you are not sitting low.  °· Walk with walker as instructed.  °You may resume a sexual relationship in one month or when given the OK by your caregiver.  °Use walker as long as suggested by your caregivers.  °You may put full weight on your legs and walk as much as is comfortable. °Avoid periods of inactivity such as sitting longer than an hour when not asleep. This helps prevent  blood clots.  °You may return to work once you are cleared by your surgeon.  °Do not drive a car for 6 weeks or until released by your surgeon.  °Do not drive while taking narcotics.  °Wear elastic stockings for two weeks following surgery during the day but you may remove then at night.  °Make sure you keep all of your appointments after your operation with all of your doctors and caregivers. You should call the office at the above phone number and make an appointment for approximately two weeks after the date of your surgery. °Please pick up a stool softener and laxative for home use as long as you are requiring pain medications. °· ICE to the affected hip every three hours for 30 minutes at a time and then as needed for pain and swelling. Continue to use ice on the hip for pain and swelling from surgery. You may notice swelling that will progress down to the foot and ankle.  This is normal after surgery.  Elevate the leg when you are not up walking on it.   °It is important for you to complete the blood thinner medication as prescribed by your doctor. °· Continue to use the breathing machine which will help keep your temperature down.  It is common for your temperature to cycle up and down following surgery, especially at night when you are not up moving around and exerting yourself.  The breathing machine keeps your lungs expanded and your temperature down. ° °RANGE OF MOTION AND STRENGTHENING EXERCISES  °These exercises are   designed to help you keep full movement of your hip joint. Follow your caregiver's or physical therapist's instructions. Perform all exercises about fifteen times, three times per day or as directed. Exercise both hips, even if you have had only one joint replacement. These exercises can be done on a training (exercise) mat, on the floor, on a table or on a bed. Use whatever works the best and is most comfortable for you. Use music or television while you are exercising so that the exercises  are a pleasant break in your day. This will make your life better with the exercises acting as a break in routine you can look forward to.  Lying on your back, slowly slide your foot toward your buttocks, raising your knee up off the floor. Then slowly slide your foot back down until your leg is straight again.  Lying on your back spread your legs as far apart as you can without causing discomfort.  Lying on your side, raise your upper leg and foot straight up from the floor as far as is comfortable. Slowly lower the leg and repeat.  Lying on your back, tighten up the muscle in the front of your thigh (quadriceps muscles). You can do this by keeping your leg straight and trying to raise your heel off the floor. This helps strengthen the largest muscle supporting your knee.  Lying on your back, tighten up the muscles of your buttocks both with the legs straight and with the knee bent at a comfortable angle while keeping your heel on the floor.   SKILLED REHAB INSTRUCTIONS: If the patient is transferred to a skilled rehab facility following release from the hospital, a list of the current medications will be sent to the facility for the patient to continue.  When discharged from the skilled rehab facility, please have the facility set up the patient's Home Health Physical Therapy prior to being released. Also, the skilled facility will be responsible for providing the patient with their medications at time of release from the facility to include their pain medication and their blood thinner medication. If the patient is still at the rehab facility at time of the two week follow up appointment, the skilled rehab facility will also need to assist the patient in arranging follow up appointment in our office and any transportation needs.  MAKE SURE YOU:  Understand these instructions.  Will watch your condition.  Will get help right away if you are not doing well or get worse.  Pick up stool softner and  laxative for home use following surgery while on pain medications. Do not remove your dressing. The dressing is waterproof--it is OK to take showers. Continue to use ice for pain and swelling after surgery. Do not use any lotions or creams on the incision until instructed by your surgeon. Total Hip Protocol.  Follow with Primary MD GATES,ROBERT NEVILL, MD in 7 days   Get CBC, CMP, 2 view Chest X ray checked  by Primary MD next visit.    Activity: WBAT R Leg with a walker with Full fall precautions use walker/cane & assistance as needed   Disposition SNF   Diet:   Heart Healthy with feeding assistance and aspiration precautions.  For Heart failure patients - Check your Weight same time everyday, if you gain over 2 pounds, or you develop in leg swelling, experience more shortness of breath or chest pain, call your Primary MD immediately. Follow Cardiac Low Salt Diet and 1.5 lit/day fluid restriction.  On your next visit with your primary care physician please Get Medicines reviewed and adjusted.   Please request your Prim.MD to go over all Hospital Tests and Procedure/Radiological results at the follow up, please get all Hospital records sent to your Prim MD by signing hospital release before you go home.   If you experience worsening of your admission symptoms, develop shortness of breath, life threatening emergency, suicidal or homicidal thoughts you must seek medical attention immediately by calling 911 or calling your MD immediately  if symptoms less severe.  You Must read complete instructions/literature along with all the possible adverse reactions/side effects for all the Medicines you take and that have been prescribed to you. Take any new Medicines after you have completely understood and accpet all the possible adverse reactions/side effects.   Do not drive, operating heavy machinery, perform activities at heights, swimming or participation in water activities or provide  baby sitting services if your were admitted for syncope or siezures until you have seen by Primary MD or a Neurologist and advised to do so again.  Do not drive when taking Pain medications.    Do not take more than prescribed Pain, Sleep and Anxiety Medications  Special Instructions: If you have smoked or chewed Tobacco  in the last 2 yrs please stop smoking, stop any regular Alcohol  and or any Recreational drug use.  Wear Seat belts while driving.   Please note  You were cared for by a hospitalist during your hospital stay. If you have any questions about your discharge medications or the care you received while you were in the hospital after you are discharged, you can call the unit and asked to speak with the hospitalist on call if the hospitalist that took care of you is not available. Once you are discharged, your primary care physician will handle any further medical issues. Please note that NO REFILLS for any discharge medications will be authorized once you are discharged, as it is imperative that you return to your primary care physician (or establish a relationship with a primary care physician if you do not have one) for your aftercare needs so that they can reassess your need for medications and monitor your lab values.

## 2016-02-19 NOTE — Discharge Summary (Addendum)
[name], is a 82 y.o. female  DOB Dec 31, 1931  MRN [id number].  Admission date:  02/16/2016  Admitting Physician  Ozella Rocks, MD  Discharge Date:  02/19/2016   Primary MD  [name], MD  Recommendations for primary care physician for things to follow:   Check CBC, BMP in 3- 4 days   Admission Diagnosis  Facial laceration, initial encounter [S01.81XA] Displaced fracture of right femoral neck, closed, initial encounter [facility]) [S72.001A]   Discharge Diagnosis  Facial laceration, initial encounter [S01.81XA] Displaced fracture of right femoral neck, closed, initial encounter (HCC) [S72.001A]     Principal Problem:   Femoral neck fracture (HCC) Active Problems:   GERD (gastroesophageal reflux disease)   Dementia with behavioral disturbance   HTN (hypertension)   Fall   Displaced fracture of right femoral neck (HCC)      Past Medical History  Diagnosis Date  . Anxiety   . GERD (gastroesophageal reflux disease)   . Insomnia   . Memory loss   . Lumbago   . dementia   . Stroke [facility]) 1985    no deficits    Past Surgical History  Procedure Laterality Date  . Tonsillectomy    . Adenoidectomy    . Cholecystectomy    . Total vaginal hysterectomy    . Appendectomy    . Abdominal hysterectomy    . Hip arthroplasty Left 02/07/2014    Procedure: ARTHROPLASTY BIPOLAR HIP;  Surgeon: Shelda Pal, MD;  Location: [facility] OR;  Service: Orthopedics;  Laterality: Left;       HPI  from the history and physical done on the day of admission:   [name] is a 82 y.o. female with a past medical history that includes advancing dementia, GERD, gait abnormality, stroke 1985 with no deficits, as as to the emergency department with the chief complaint of fall. Initial evaluation in the emergency department  reveals right femoral neck fracture as well as hypertension.  Information is obtained from the husband is at the bedside. He states he took a sleep aid last night did not hear the patient get up but he awakened his morning and found her on the floor. Husband speculates that she was coming back from the bathroom given that she had fallen towards the bed. She was awake and alert. Unknown how long she was on the floor. She did not call out. He essentially to get up she did complain of right hip pain. He reports she's been in her usual state of health. No complaints of chest pain palpitation shortness of breath cough fever chills recent sick contacts. No abdominal pain nausea vomiting diarrhea. No complaints of dysuria hematuria frequency or urgency. Of note patient was in the emergency department last week with behavioral disturbances. Her cup was negative was discharged home.  The emergency department she is afebrile hemodynamically stable with a blood pressure somewhat elevated. She is not hypoxic.    Hospital Course:    1. Mechanical fall at home with right femoral neck fracture.  Status post right hip hemiarthroplasty on 02/17/2016 by Dr. Leroy Libman, patient seems to have tolerated the procedure well, aspirin 81md twice a day per orthopedics for DVT prophylaxis, weightbearing as tolerated R Leg with walker, will need SNF. Minimal perioperative blood loss related anemia not requiring transfusion.  2. Underlying dementia. At risk for delirium, continue home dose Aricept, memantine and Seroquel. Minimize narcotics and benzodiazepines.  3. Hypertension. Placed on Lopressor.    4. Osteoporosis. Continue vitamin D supplementation along with raloxifene.  5. Brief Run of SVT at 4pm on 02/17/2016 - Stable EKG & TSH, PO lopressor continue, now in NSR.  6. Mild underlying dysphagia. Cleared by speech, feeding assistance and aspiration precautions.  7. Ur.Retention - Foley, Flomax, increase activity, trial  of foley removal in 3-4 days at [facility].     Discharge Condition: Stable  Follow UP  Follow-up Information    Follow up with Swinteck, Cloyde Reams, MD. Schedule an appointment as soon as possible for a visit in 2 weeks.   Specialty:  Orthopedic Surgery   Why:  For wound re-check   Contact information:   [address] [place] [address] [phone]       Follow up with GATES,ROBERT NEVILL, MD. Schedule an appointment as soon as possible for a visit in 1 week.   Specialty:  Internal Medicine   Contact information:   [address] [place] [address] [phone]       Follow up with [facility] .   Specialty:  Skilled Nursing Facility   Contact information:   [address] [place] [address] [phone]       Consults obtained - Ortho  Diet and Activity recommendation: See Discharge Instructions below  Discharge Instructions           Discharge Instructions    Diet - low sodium heart healthy    Complete by:  As directed      Discharge instructions    Complete by:  As directed   Follow with Primary MD GATES,ROBERT NEVILL, MD in 7 days   Get CBC, CMP, 2 view Chest X ray checked  by Primary MD next visit.    Activity: WBAT R Leg with a walker with Full fall precautions use walker/cane & assistance as needed   Disposition SNF   Diet:   Heart Healthy with feeding assistance and aspiration precautions.  For Heart failure patients - Check your Weight same time everyday, if you gain over 2 pounds, or you develop in leg swelling, experience more shortness of breath or chest pain, call your Primary MD immediately. Follow Cardiac Low Salt Diet and 1.5 lit/day fluid restriction.   On your next visit with your primary care physician please Get Medicines reviewed and adjusted.   Please request your Prim.MD to go over all Hospital Tests and Procedure/Radiological results at the follow up, please get all  Hospital records sent to your Prim MD by signing hospital release before you go home.   If you experience worsening of your admission symptoms, develop shortness of breath, life threatening emergency, suicidal or homicidal thoughts you must seek medical attention immediately by calling 911 or calling your MD immediately  if symptoms less severe.  You Must read complete instructions/literature along with all the possible adverse reactions/side effects for all the Medicines you take and that have been prescribed to you. Take any new Medicines after you have completely understood and accpet all the possible adverse reactions/side effects.  Do not drive, operating heavy machinery, perform activities at heights, swimming or participation in water activities or provide baby sitting services if your were admitted for syncope or siezures until you have seen by Primary MD or a Neurologist and advised to do so again.  Do not drive when taking Pain medications.    Do not take more than prescribed Pain, Sleep and Anxiety Medications  Special Instructions: If you have smoked or chewed Tobacco  in the last 2 yrs please stop smoking, stop any regular Alcohol  and or any Recreational drug use.  Wear Seat belts while driving.   Please note  You were cared for by a hospitalist during your hospital stay. If you have any questions about your discharge medications or the care you received while you were in the hospital after you are discharged, you can call the unit and asked to speak with the hospitalist on call if the hospitalist that took care of you is not available. Once you are discharged, your primary care physician will handle any further medical issues. Please note that NO REFILLS for any discharge medications will be authorized once you are discharged, as it is imperative that you return to your primary care physician (or establish a relationship with a primary care physician if you do not have one) for  your aftercare needs so that they can reassess your need for medications and monitor your lab values.             Discharge Medications       Medication List    TAKE these medications        aspirin 81 MG EC tablet  Take 1 tablet (81 mg total) by mouth 2 (two) times daily after a meal.     donepezil 10 MG tablet  Commonly known as:  ARICEPT  Take 1 tablet (10 mg total) by mouth at bedtime.     fluPHENAZine decanoate 25 MG/ML injection  Commonly known as:  PROLIXIN  Inject 25 mg into the muscle every 21 ( twenty-one) days.     HYDROcodone-acetaminophen 5-325 MG tablet  Commonly known as:  NORCO/VICODIN  Take 1 tablet by mouth every 6 (six) hours as needed for moderate pain.     memantine 28 MG Cp24 24 hr capsule  Commonly known as:  NAMENDA XR  Take 1 capsule (28 mg total) by mouth at bedtime.     metoprolol 100 MG tablet  Commonly known as:  LOPRESSOR  Take 1 tablet (100 mg total) by mouth 2 (two) times daily.     pantoprazole 40 MG tablet  Commonly known as:  PROTONIX  Take 1 tablet (40 mg total) by mouth daily.     QUEtiapine 50 MG tablet  Commonly known as:  SEROQUEL  Take 50 mg by mouth at bedtime.     raloxifene 60 MG tablet  Commonly known as:  EVISTA  Take 60 mg by mouth daily.     tamsulosin 0.4 MG Caps capsule  Commonly known as:  FLOMAX  Take 1 capsule (0.4 mg total) by mouth daily.     trihexyphenidyl 2 MG tablet  Commonly known as:  ARTANE  Take 2 mg by mouth 2 (two) times daily.     VITAMIN B 12 PO  Take 1 tablet by mouth daily.     Vitamin D (Cholecalciferol) 1000 units Tabs  Take 2,000 Units by mouth daily.        Major procedures and Radiology Reports - PLEASE review  detailed and final reports for all details, in brief -       Dg Chest 1 View  02/16/2016  CLINICAL DATA:  Fall today, right hip pain EXAM: CHEST 1 VIEW COMPARISON:  02/06/2014 FINDINGS: Cardiomediastinal silhouette is stable. Mild elevation of the right  hemidiaphragm. No infiltrate or pulmonary edema. Atherosclerotic calcifications of thoracic aorta. There is no pneumothorax. IMPRESSION: No infiltrate or pulmonary edema. Mild elevation of the right hemidiaphragm again noted. No pneumothorax. Electronically Signed   By: Natasha Mead M.D.   On: 02/16/2016 09:23   Ct Head Wo Contrast  02/16/2016  CLINICAL DATA:  Status post fall earlier today EXAM: CT HEAD WITHOUT CONTRAST CT CERVICAL SPINE WITHOUT CONTRAST TECHNIQUE: Multidetector CT imaging of the head and cervical spine was performed following the standard protocol without intravenous contrast. Multiplanar CT image reconstructions of the cervical spine were also generated. COMPARISON:  Head CT February 09, 2016 FINDINGS: CT HEAD FINDINGS Moderate diffuse atrophy is stable. There is no intracranial mass, hemorrhage, extra-axial fluid collection, or midline shift. Patchy small vessel disease in the centra semiovale bilaterally is stable. There is no new gray-white compartment lesion. No acute infarct evident. Basal ganglia calcification is noted bilaterally. Bony calvarium appears intact. The mastoid air cells are clear. No intraorbital lesions are apparent. CT CERVICAL SPINE FINDINGS There is no fracture or spondylolisthesis. Prevertebral soft tissues and predental space regions are normal. There is moderate disc space narrowing at C4-5, C5-6, and C6-7. There is facet hypertrophy at multiple levels bilaterally. There is exit foraminal narrowing at multiple levels, most notably at C3-4 on the left, C4-5 on the right, C5-6 on the right, and C6-7 bilaterally. IMPRESSION: CT head: Moderate generalized atrophy with patchy periventricular small vessel disease. No acute infarct evident. No hemorrhage, mass, or extra-axial fluid collection. CT cervical spine: Multilevel arthropathy. No demonstrable fracture or spondylolisthesis. Electronically Signed   By: Bretta Bang III M.D.   On: 02/16/2016 09:42   Ct Head Wo  Contrast  02/09/2016  CLINICAL DATA:  Confusion.  History of dementia EXAM: CT HEAD WITHOUT CONTRAST TECHNIQUE: Contiguous axial images were obtained from the base of the skull through the vertex without intravenous contrast. COMPARISON:  January 17, 2011 FINDINGS: Moderate diffuse atrophy is stable. There is no intracranial mass, hemorrhage, extra-axial fluid collection, or midline shift. There is patchy small vessel disease in the centra semiovale bilaterally. Elsewhere, gray-white compartments appear normal. No acute infarct is evident. There are basal ganglia calcifications bilaterally, a stable finding. The bony calvarium appears intact. The mastoid air cells are clear. No intraorbital lesions are identified. IMPRESSION: Moderate atrophy with patchy small vessel disease in the centra semiovale bilaterally. No intracranial mass, hemorrhage, or acute appearing infarct. Basal ganglia calcification is likely physiologic in this age group, stable. Electronically Signed   By: Bretta Bang III M.D.   On: 02/09/2016 13:36   Ct Cervical Spine Wo Contrast  02/16/2016  CLINICAL DATA:  Status post fall earlier today EXAM: CT HEAD WITHOUT CONTRAST CT CERVICAL SPINE WITHOUT CONTRAST TECHNIQUE: Multidetector CT imaging of the head and cervical spine was performed following the standard protocol without intravenous contrast. Multiplanar CT image reconstructions of the cervical spine were also generated. COMPARISON:  Head CT February 09, 2016 FINDINGS: CT HEAD FINDINGS Moderate diffuse atrophy is stable. There is no intracranial mass, hemorrhage, extra-axial fluid collection, or midline shift. Patchy small vessel disease in the centra semiovale bilaterally is stable. There is no new gray-white compartment lesion. No acute infarct evident. Basal  ganglia calcification is noted bilaterally. Bony calvarium appears intact. The mastoid air cells are clear. No intraorbital lesions are apparent. CT CERVICAL SPINE FINDINGS There  is no fracture or spondylolisthesis. Prevertebral soft tissues and predental space regions are normal. There is moderate disc space narrowing at C4-5, C5-6, and C6-7. There is facet hypertrophy at multiple levels bilaterally. There is exit foraminal narrowing at multiple levels, most notably at C3-4 on the left, C4-5 on the right, C5-6 on the right, and C6-7 bilaterally. IMPRESSION: CT head: Moderate generalized atrophy with patchy periventricular small vessel disease. No acute infarct evident. No hemorrhage, mass, or extra-axial fluid collection. CT cervical spine: Multilevel arthropathy. No demonstrable fracture or spondylolisthesis. Electronically Signed   By: Bretta BangWilliam  Woodruff III M.D.   On: 02/16/2016 09:42   Pelvis Portable  02/17/2016  CLINICAL DATA:  Anterior approach right hemiarthroplasty right hip EXAM: PORTABLE PELVIS 1-2 VIEWS COMPARISON:  03/04/2014 FINDINGS: Stable left hip arthroplasty. Pelvic bones intact. Patient now status post right hip arthroplasty with anticipated soft tissue change over the right hip joint postoperatively. IMPRESSION: Anticipated postoperative appearance Electronically Signed   By: Esperanza Heiraymond  Rubner M.D.   On: 02/17/2016 10:21   Dg Chest Port 1 View  02/19/2016  CLINICAL DATA:  Shortness of Breath EXAM: PORTABLE CHEST 1 VIEW COMPARISON:  02/16/2016 FINDINGS: Cardiac shadow is stable. The overall inspiratory effort is poor when compare with the previous exam. Elevation of the right hemidiaphragm is again noted. No focal infiltrate or sizable effusion is seen. IMPRESSION: Poor inspiratory effort without focal acute abnormality. Electronically Signed   By: Alcide CleverMark  Lukens M.D.   On: 02/19/2016 11:27   Dg Hip Operative Unilat With Pelvis Right  02/17/2016  CLINICAL DATA:  Right hip replacement. EXAM: OPERATIVE right HIP (WITH PELVIS IF PERFORMED)  VIEWS TECHNIQUE: Fluoroscopic spot image(s) were submitted for interpretation post-operatively. COMPARISON:  February 16, 2016 FLUOROSCOPY  TIME:  6 seconds. FINDINGS: Three images were obtained during a right hip replacement. By the end of the study, acetabular and femoral components are in good position. IMPRESSION: Right hip replacement as described above. Electronically Signed   By: Gerome Samavid  Williams III M.D   On: 02/17/2016 09:20   Dg Hip Unilat With Pelvis 2-3 Views Right  02/16/2016  CLINICAL DATA:  Fall today, right hip pain EXAM: DG HIP (WITH OR WITHOUT PELVIS) 2-3V RIGHT COMPARISON:  None. FINDINGS: Three views of the right hip submitted. There is mild displaced fracture of the right femoral neck. Diffuse osteopenia. Left hip prosthesis partially visualized. There is anatomic alignment of left hip prosthesis. IMPRESSION: Mild displaced fracture of the right femoral neck. Diffuse osteopenia. Partially visualized left hip prosthesis with anatomic alignment. Electronically Signed   By: Natasha MeadLiviu  Pop M.D.   On: 02/16/2016 09:22    Micro Results      Recent Results (from the past 240 hour(s))  Surgical pcr screen     Status: None   Collection Time: 02/16/16  8:33 PM  Result Value Ref Range Status   MRSA, PCR NEGATIVE NEGATIVE Final   Staphylococcus aureus NEGATIVE NEGATIVE Final    Comment:        The Xpert SA Assay (FDA approved for NASAL specimens in patients over 80 years of age), is one component of a comprehensive surveillance program.  Test performance has been validated by [facility] for patients greater than or equal to 80 year old. It is not intended to diagnose infection nor to guide or monitor treatment.  Today   Subjective    [name] today has no headache,no chest abdominal pain,no new weakness tingling or numbness, feels much better.   Objective   Blood pressure 145/51, pulse 92, temperature 99.7 F (37.6 C), temperature source Oral, resp. rate 18, SpO2 93 %.   Intake/Output Summary (Last 24 hours) at 02/19/16 1328 Last data filed at 02/18/16 1810  Gross per 24 hour  Intake    240  ml  Output    500 ml  Net   -260 ml    Exam Awake , mildly confused at baseline, No new F.N deficits, Normal affect [place].AT,PERRAL Supple Neck,No JVD, No cervical lymphadenopathy appriciated.  Symmetrical Chest wall movement, Good air movement bilaterally, CTAB RRR,No Gallops,Rubs or new Murmurs, No Parasternal Heave +ve B.Sounds, Abd Soft, Non tender, No organomegaly appriciated, No rebound -guarding or rigidity. No Cyanosis, Clubbing or edema, No new Rash or bruise   Data Review   CBC w Diff:  Lab Results  Component Value Date   WBC 10.0 02/18/2016   HGB 10.6* 02/19/2016   HCT 31.9* 02/19/2016   PLT 210 02/18/2016   LYMPHOPCT 5 02/16/2016   MONOPCT 4 02/16/2016   EOSPCT 0 02/16/2016   BASOPCT 0 02/16/2016    CMP:  Lab Results  Component Value Date   NA 139 02/18/2016   K 3.8 02/18/2016   CL 104 02/18/2016   CO2 24 02/18/2016   BUN 13 02/18/2016   CREATININE 0.73 02/18/2016  .   Total Time in preparing paper work, data evaluation and todays exam - 35 minutes  Leroy Sea M.D on 02/19/2016 at 1:28 PM  [facility]   Office  [phone]

## 2016-02-19 NOTE — Clinical Social Work Note (Addendum)
CSW received passar number [id number] [id number], CSW contacted [facility] who can accept patient today.  Patient's husband and daughter were at bedside.  CSW provided psychosocial support to patient's family regarding applying for long term care Medicaid, and explaining to patient's family what to expect at SNF.  Patient to be d/c'ed today to [facility].  Patient and family agreeable to plans will transport via ems RN to call report to [phone] room 216A.  Windell MouldingEric Suzzette Gasparro, MSW, Theresia MajorsLCSWA [phone]

## 2016-02-19 NOTE — Progress Notes (Signed)
Orthopedic Tech Progress Note Patient Details:  Jamie HatcherHelen Barber [birth year] [id number]  Patient ID: [name], female   DOB: [birth year], [age] y.o.   MRN: [id number]   Saul FordyceJennifer C Abram Sax 02/19/2016, 1:27 PMTrapeze bar

## 2016-02-22 DIAGNOSIS — D649 Anemia, unspecified: Secondary | ICD-10-CM | POA: Diagnosis not present

## 2016-02-22 LAB — BASIC METABOLIC PANEL
Anion gap: 7 (ref 5–15)
BUN: 19 mg/dL (ref 6–20)
CO2: 29 mmol/L (ref 22–32)
Calcium: 8.6 mg/dL — ABNORMAL LOW (ref 8.9–10.3)
Chloride: 108 mmol/L (ref 101–111)
Creatinine, Ser: 0.71 mg/dL (ref 0.44–1.00)
GFR calc Af Amer: >60 mL/min (ref >60)
GLUCOSE: 100 mg/dL — AB (ref 65–99)
POTASSIUM: 3.8 mmol/L (ref 3.5–5.1)
Sodium: 144 mmol/L (ref 135–145)

## 2016-02-22 LAB — CBC WITH DIFFERENTIAL/PLATELET
Basophils Absolute: 0.1 10*3/uL (ref 0–0.1)
Basophils Relative: 1 %
EOS PCT: 4 %
Eosinophils Absolute: 0.3 10*3/uL (ref 0–0.7)
HEMATOCRIT: 31.9 % — AB (ref 35.0–47.0)
Hemoglobin: 10.6 g/dL — ABNORMAL LOW (ref 12.0–16.0)
LYMPHS ABS: 1.5 10*3/uL (ref 1.0–3.6)
LYMPHS PCT: 19 %
MCH: 29.3 pg (ref 26.0–34.0)
MCHC: 33.3 g/dL (ref 32.0–36.0)
MCV: 88 fL (ref 80.0–100.0)
MONO ABS: 0.7 10*3/uL (ref 0.2–0.9)
Monocytes Relative: 9 %
Neutro Abs: 5.4 10*3/uL (ref 1.4–6.5)
Neutrophils Relative %: 67 %
PLATELETS: 281 10*3/uL (ref 150–440)
RBC: 3.63 MIL/uL — AB (ref 3.80–5.20)
RDW: 13 % (ref 11.5–14.5)
WBC: 7.9 10*3/uL (ref 3.6–11.0)

## 2016-04-03 ENCOUNTER — Ambulatory Visit: Payer: Medicare Other | Admitting: Neurology

## 2017-07-04 IMAGING — CT CT CERVICAL SPINE W/O CM
5 of 6 series · 15 of 33 positions shown, 17 images · non-contrast
Comparison: Head CT February 09, 2016

CLINICAL DATA: Status post fall earlier today

EXAM:
CT HEAD WITHOUT CONTRAST
CT CERVICAL SPINE WITHOUT CONTRAST
TECHNIQUE: Multidetector CT imaging of the head and cervical spine was
performed following the standard protocol without intravenous
contrast. Multiplanar CT image reconstructions of the cervical spine
were also generated.

[Series 4: head bone · axial · 0.45mm/px · z∈[-105,-57]mm · 2 of 72 slices shown]
[im 24/72  bone]
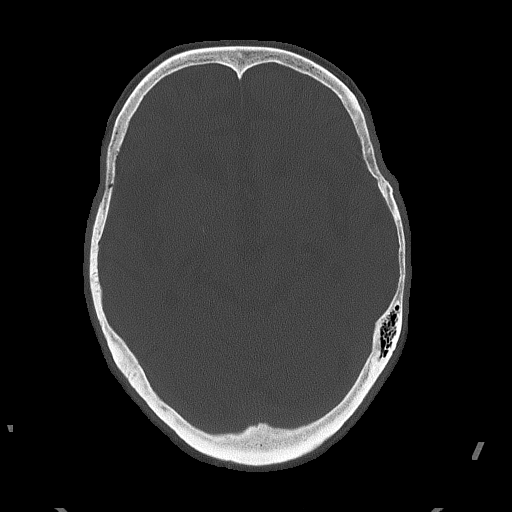
[im 48/72  bone]
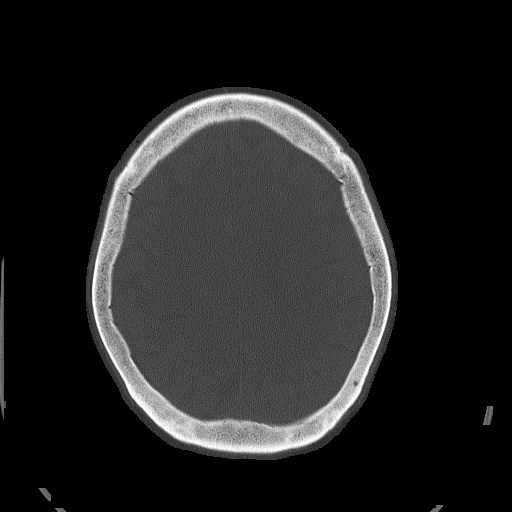

[Series 5: c_spine 2.0 st · axial · 0.26mm/px · z∈[-272,-176]mm · 3 of 96 slices shown, 4 images]
[im 24/96  soft-tissue]
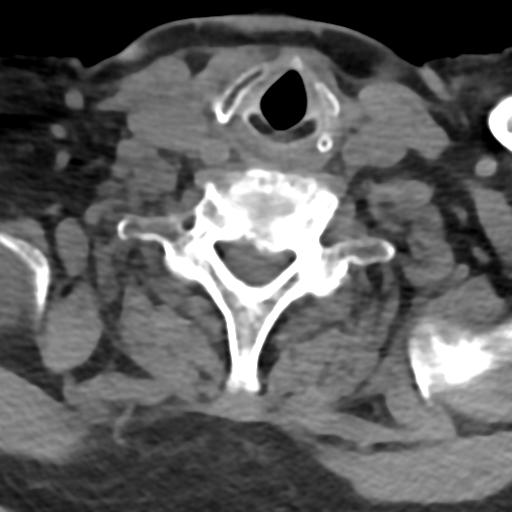
[im 24/96  bone]
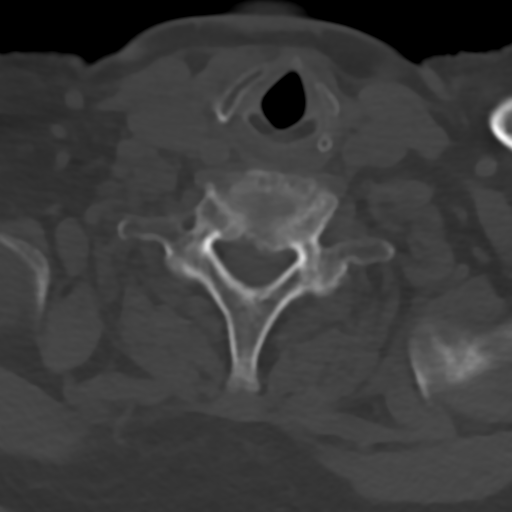
[im 48/96  bone]
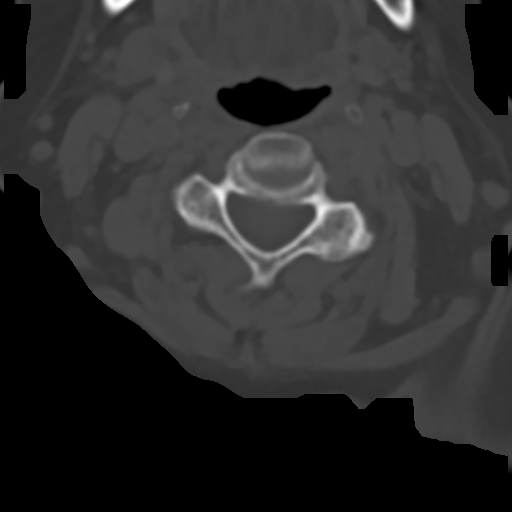
[im 72/96  bone]
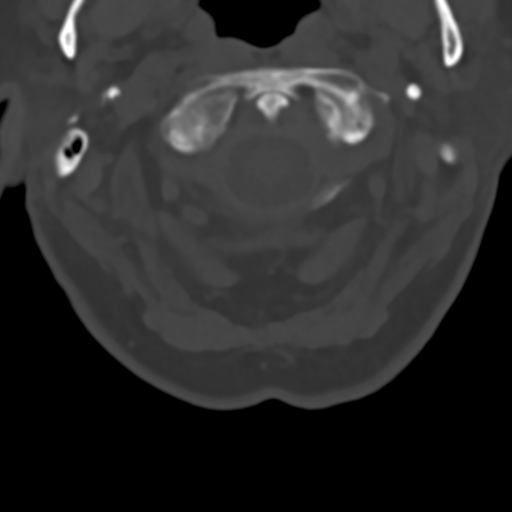

[Series 7: c_spine 2.0 sag bone · sagittal · 0.34mm/px · 5 of 49 slices shown, 6 images]
[im 17/49  bone]
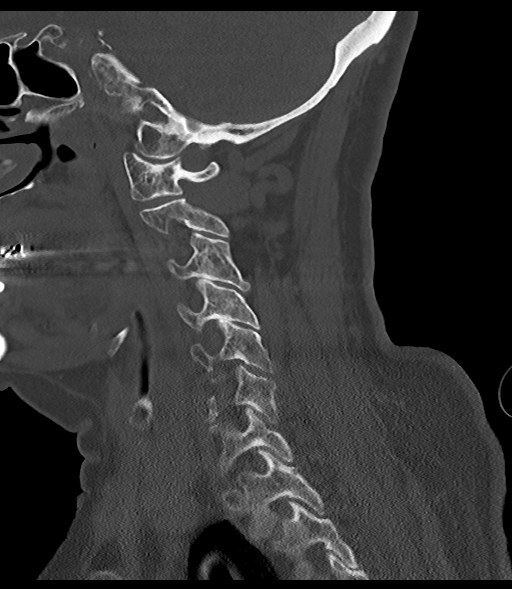
[im 21/49  bone]
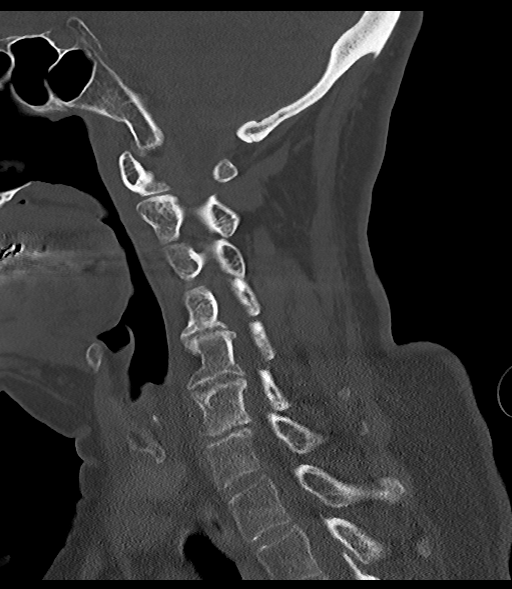
[im 25/49  soft-tissue]
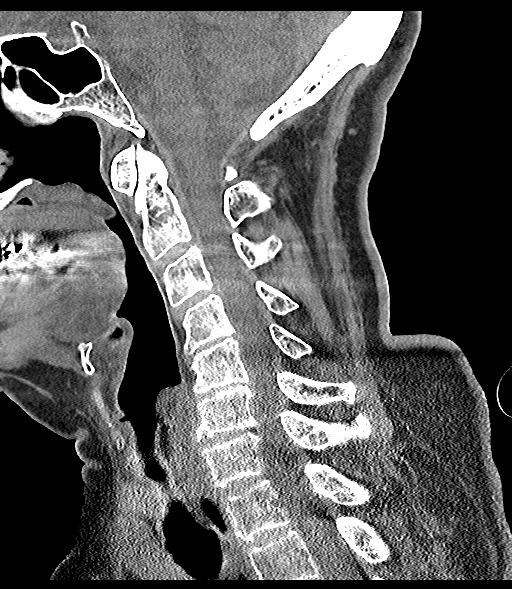
[im 25/49  bone]
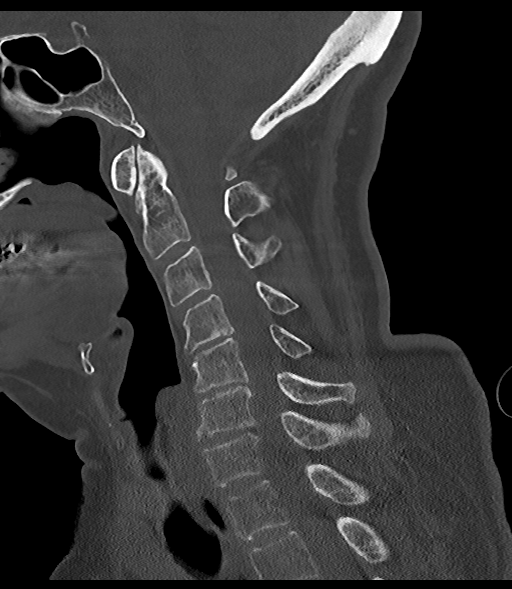
[im 29/49  bone]
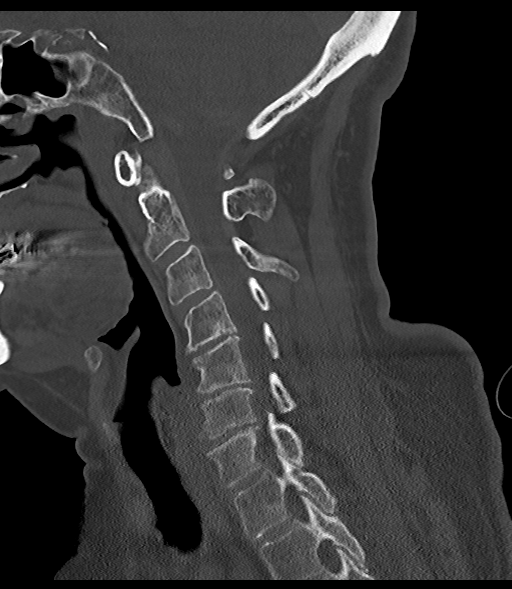
[im 33/49  bone]
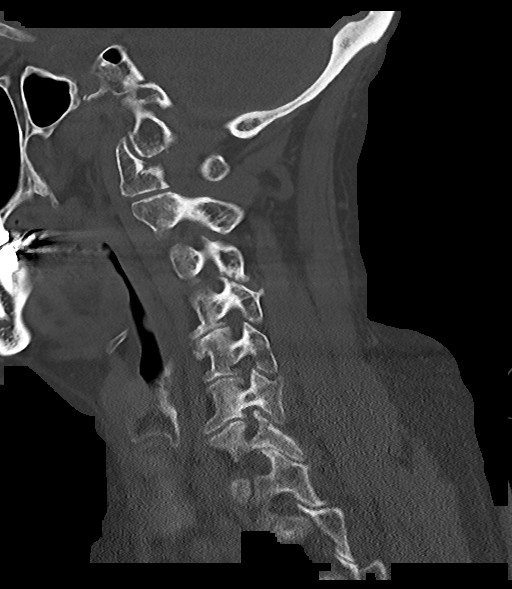

[Series 8: c_spine 2.0 cor bone · coronal · 0.35mm/px · 3 of 54 slices shown]
[im 11/54  bone]
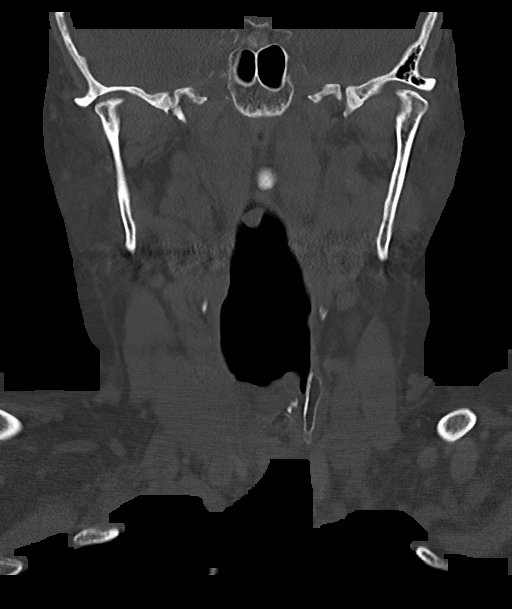
[im 22/54  bone]
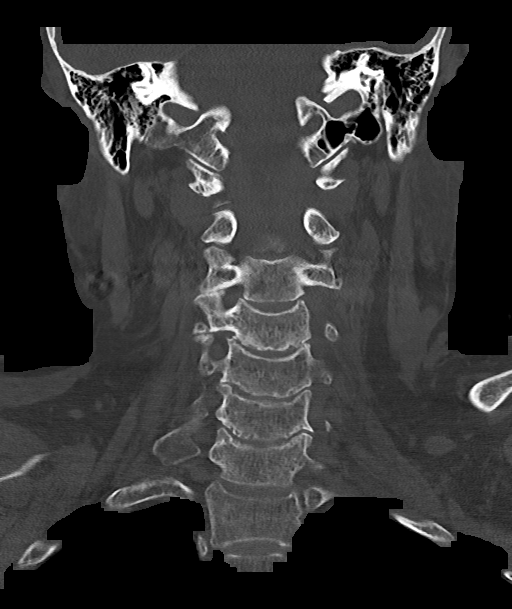
[im 32/54  bone]
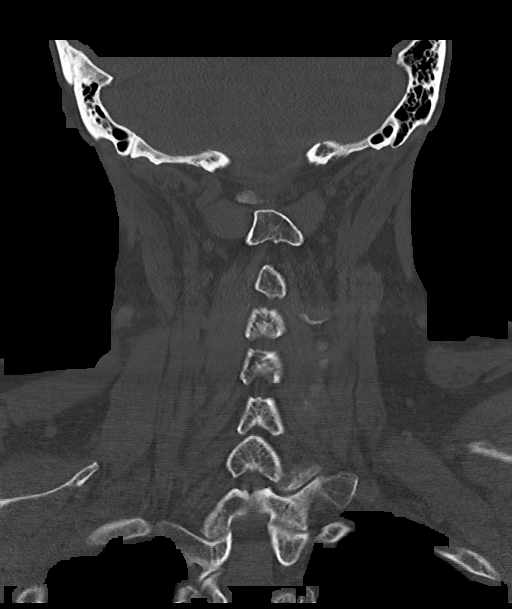

[Series 9: c_spine 2.0 orthogonals · axial · 0.21mm/px · z∈[-275,-227]mm · 2 of 88 slices shown]
[im 30/88  bone]
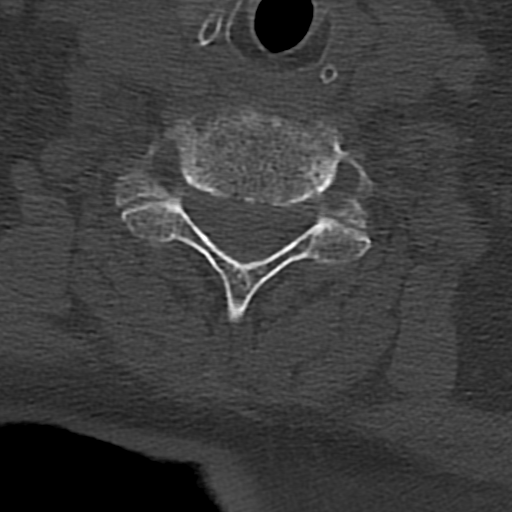
[im 59/88  bone]
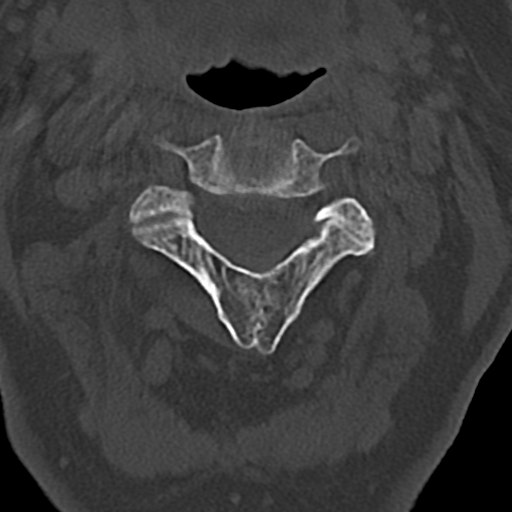

[15 of 33 positions shown; findings below may reference images not displayed]

FINDINGS: CT HEAD FINDINGS

Moderate diffuse atrophy is stable. There is no intracranial mass,
hemorrhage, extra-axial fluid collection, or midline shift. Patchy
small vessel disease in the centra semiovale bilaterally is stable.
There is no new gray-white compartment lesion. No acute infarct
evident. Basal ganglia calcification is noted bilaterally. Bony
calvarium appears intact. The mastoid air cells are clear. No
intraorbital lesions are apparent.

CT CERVICAL SPINE FINDINGS

There is no fracture or spondylolisthesis. Prevertebral soft tissues
and predental space regions are normal. There is moderate disc space
narrowing at C4-5, C5-6, and C6-7. There is facet hypertrophy at
multiple levels bilaterally. There is exit foraminal narrowing at
multiple levels, most notably at C3-4 on the left, C4-5 on the
right, C5-6 on the right, and C6-7 bilaterally.
IMPRESSION: CT head: Moderate generalized atrophy with patchy periventricular
small vessel disease. No acute infarct evident. No hemorrhage, mass,
or extra-axial fluid collection.

CT cervical spine: Multilevel arthropathy. No demonstrable fracture
or spondylolisthesis.

## 2017-07-05 IMAGING — RF DG HIP (WITH PELVIS) OPERATIVE*R*
1 series · 3 of 3 positions shown · non-contrast
Comparison: February 16, 2016

FLUOROSCOPY TIME:  6 seconds.

CLINICAL DATA: Right hip replacement.

EXAM:
OPERATIVE right HIP (WITH PELVIS IF PERFORMED)  VIEWS
TECHNIQUE: Fluoroscopic spot image(s) were submitted for interpretation
post-operatively.

[Series 1: run · 3 of 3 slices shown]
[im 1/3]
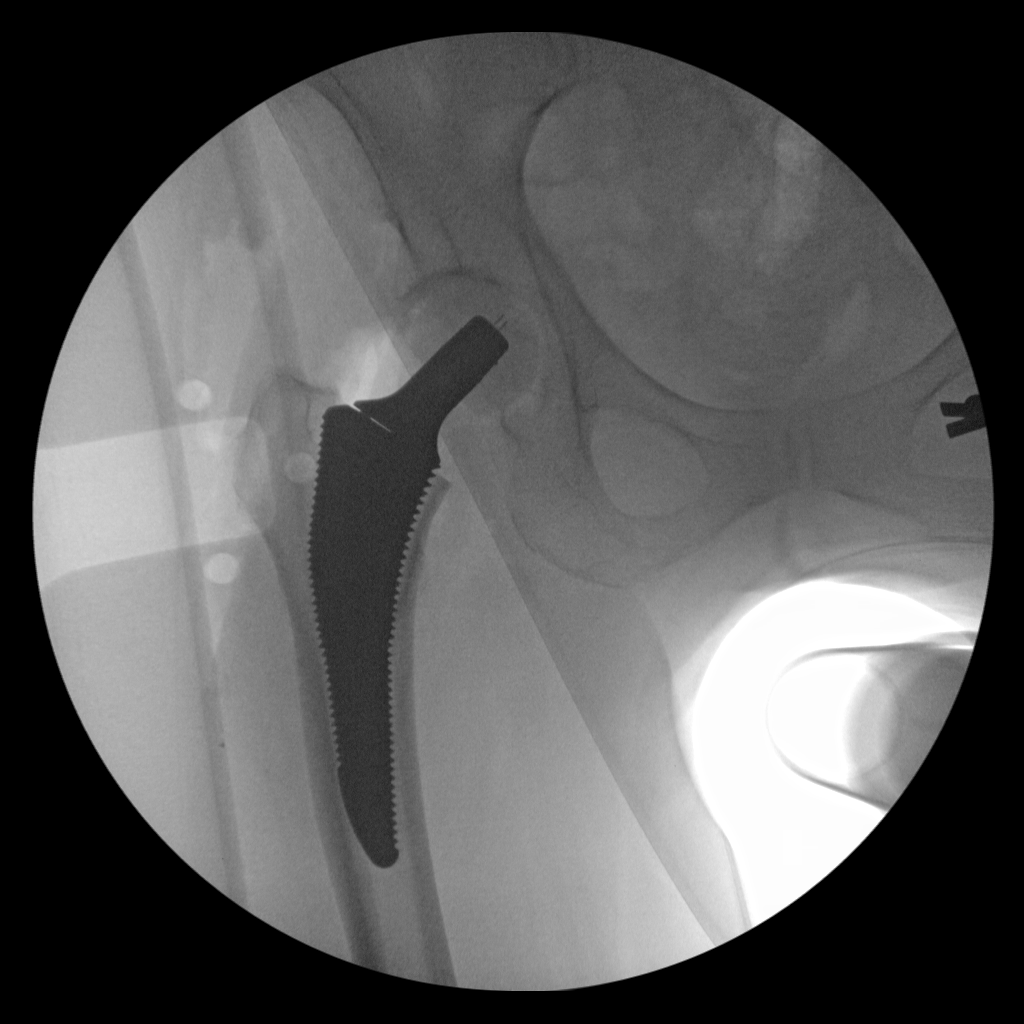
[im 2/3]
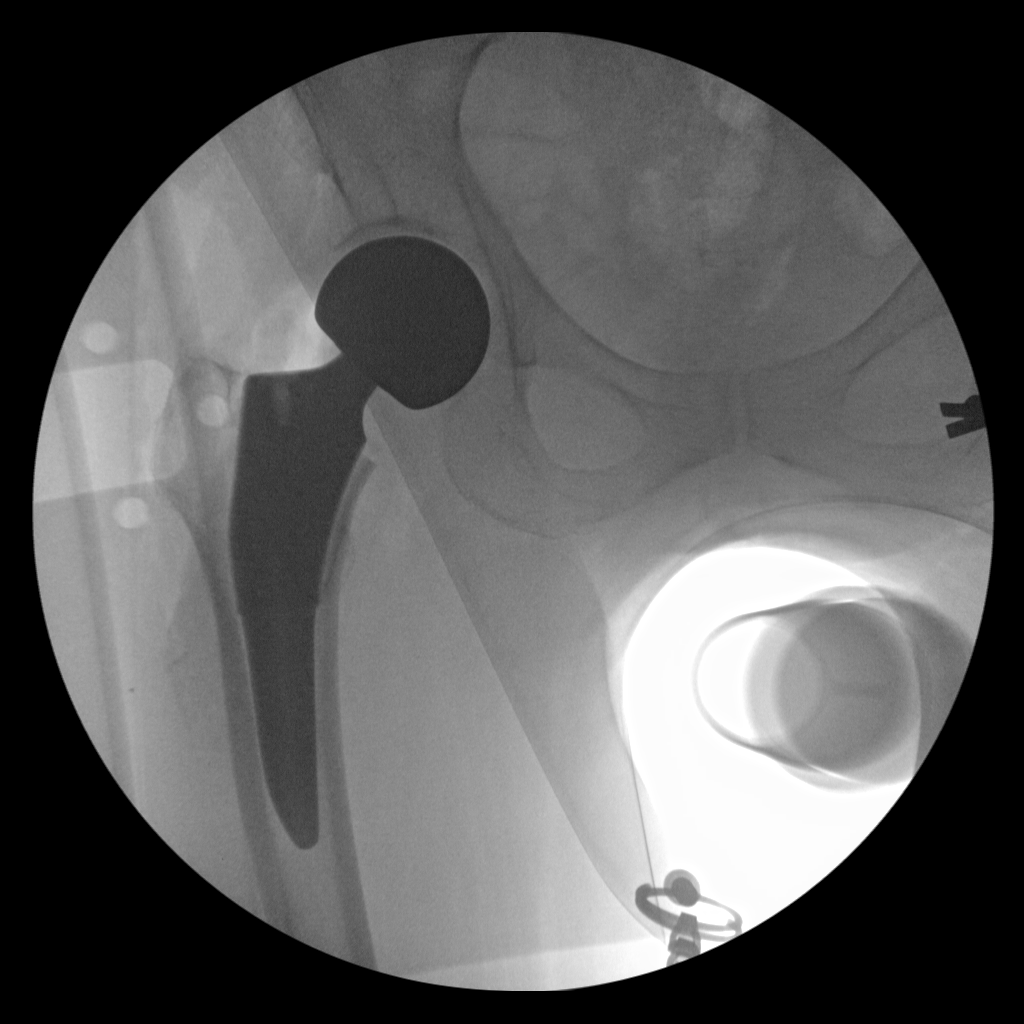
[im 3/3]
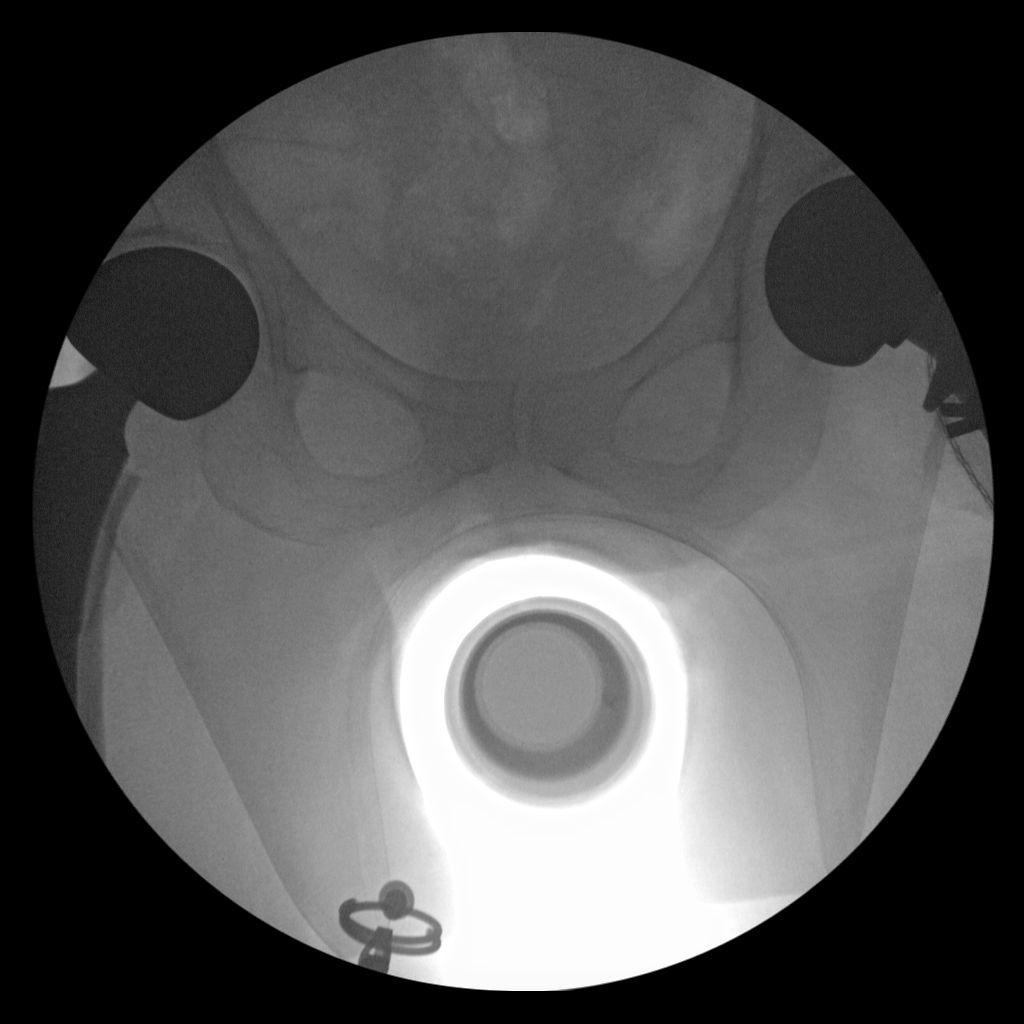

[3 of 3 positions shown; findings below may reference images not displayed]

FINDINGS: Three images were obtained during a right hip replacement. By the
end of the study, acetabular and femoral components are in good
position.
IMPRESSION: Right hip replacement as described above.

## 2017-07-05 IMAGING — DX DG PORTABLE PELVIS
1 series · 1 of 1 positions shown · non-contrast
Comparison: 03/04/2014

CLINICAL DATA: Anterior approach right hemiarthroplasty right hip

EXAM:
PORTABLE PELVIS 1-2 VIEWS

[pelvis ap]
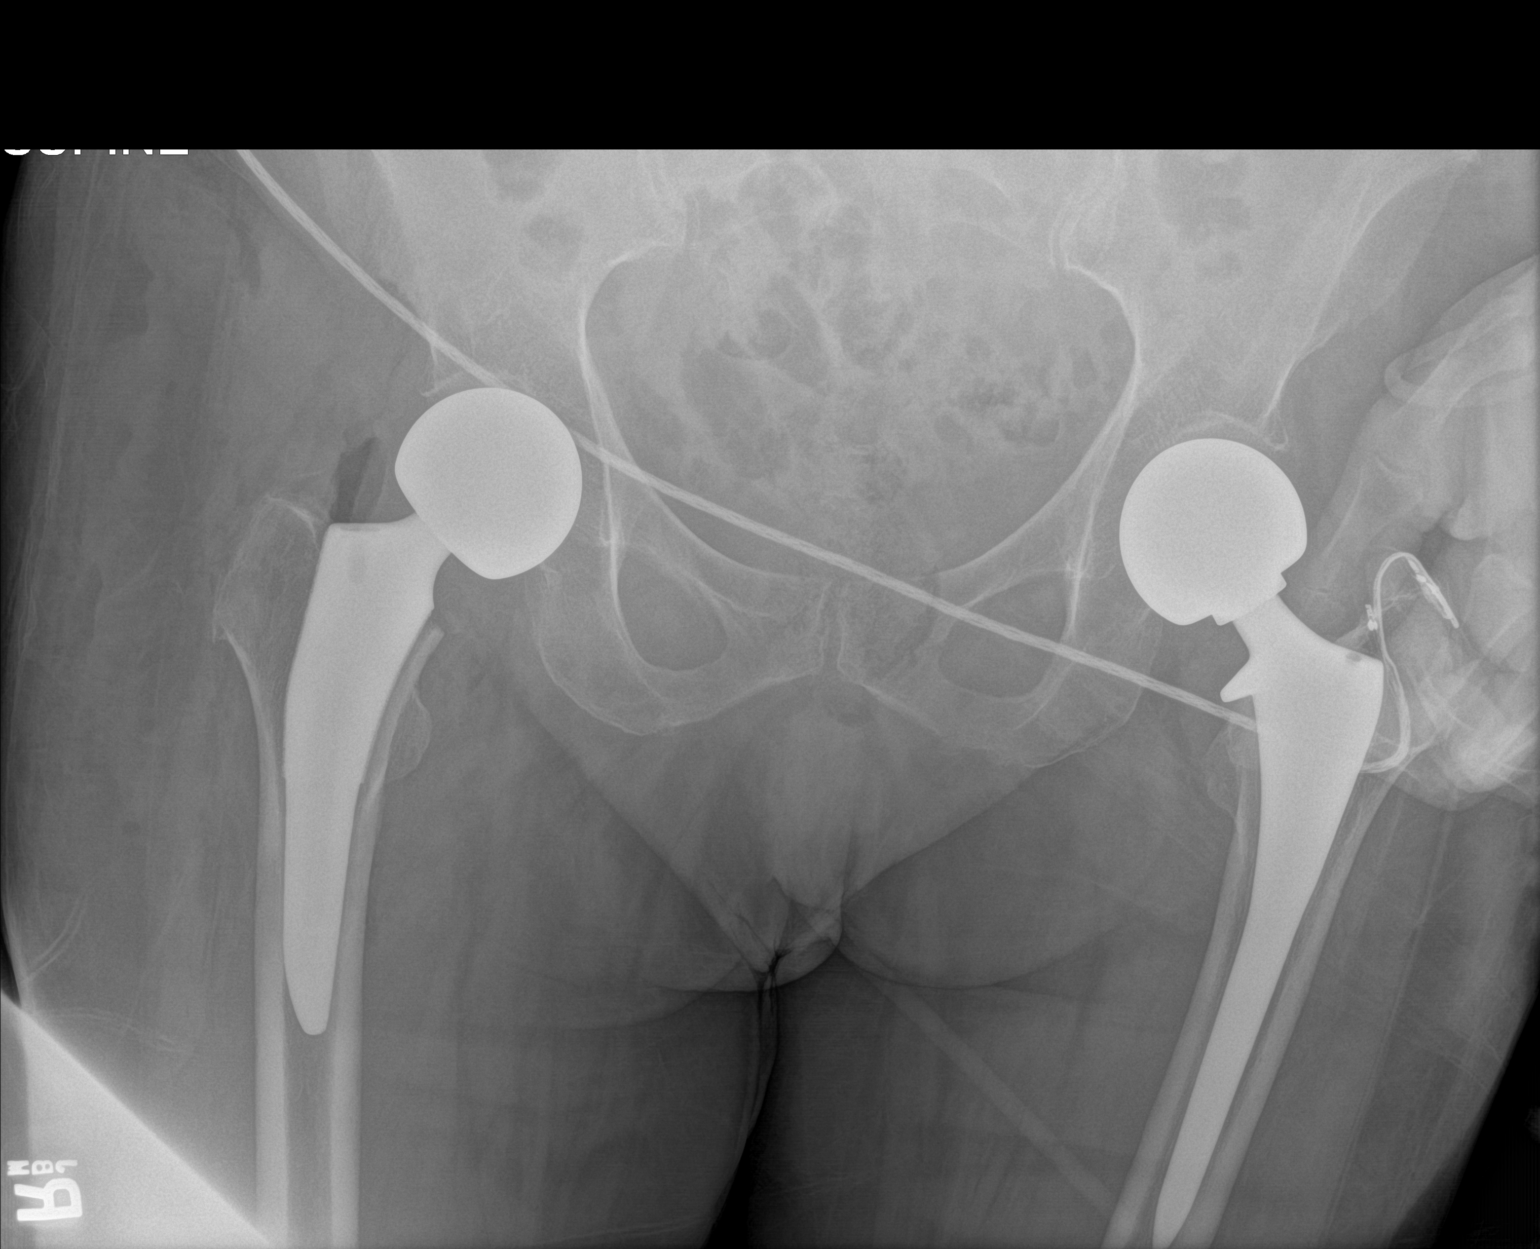

[1 of 1 positions shown; findings below may reference images not displayed]

FINDINGS: Stable left hip arthroplasty. Pelvic bones intact. Patient now
status post right hip arthroplasty with anticipated soft tissue
change over the right hip joint postoperatively.
IMPRESSION: Anticipated postoperative appearance

## 2017-07-07 IMAGING — DX DG CHEST 1V PORT
1 series · 1 of 1 positions shown · non-contrast
Comparison: 02/16/2016

CLINICAL DATA: Shortness of Breath

EXAM:
PORTABLE CHEST 1 VIEW

[chest ap]
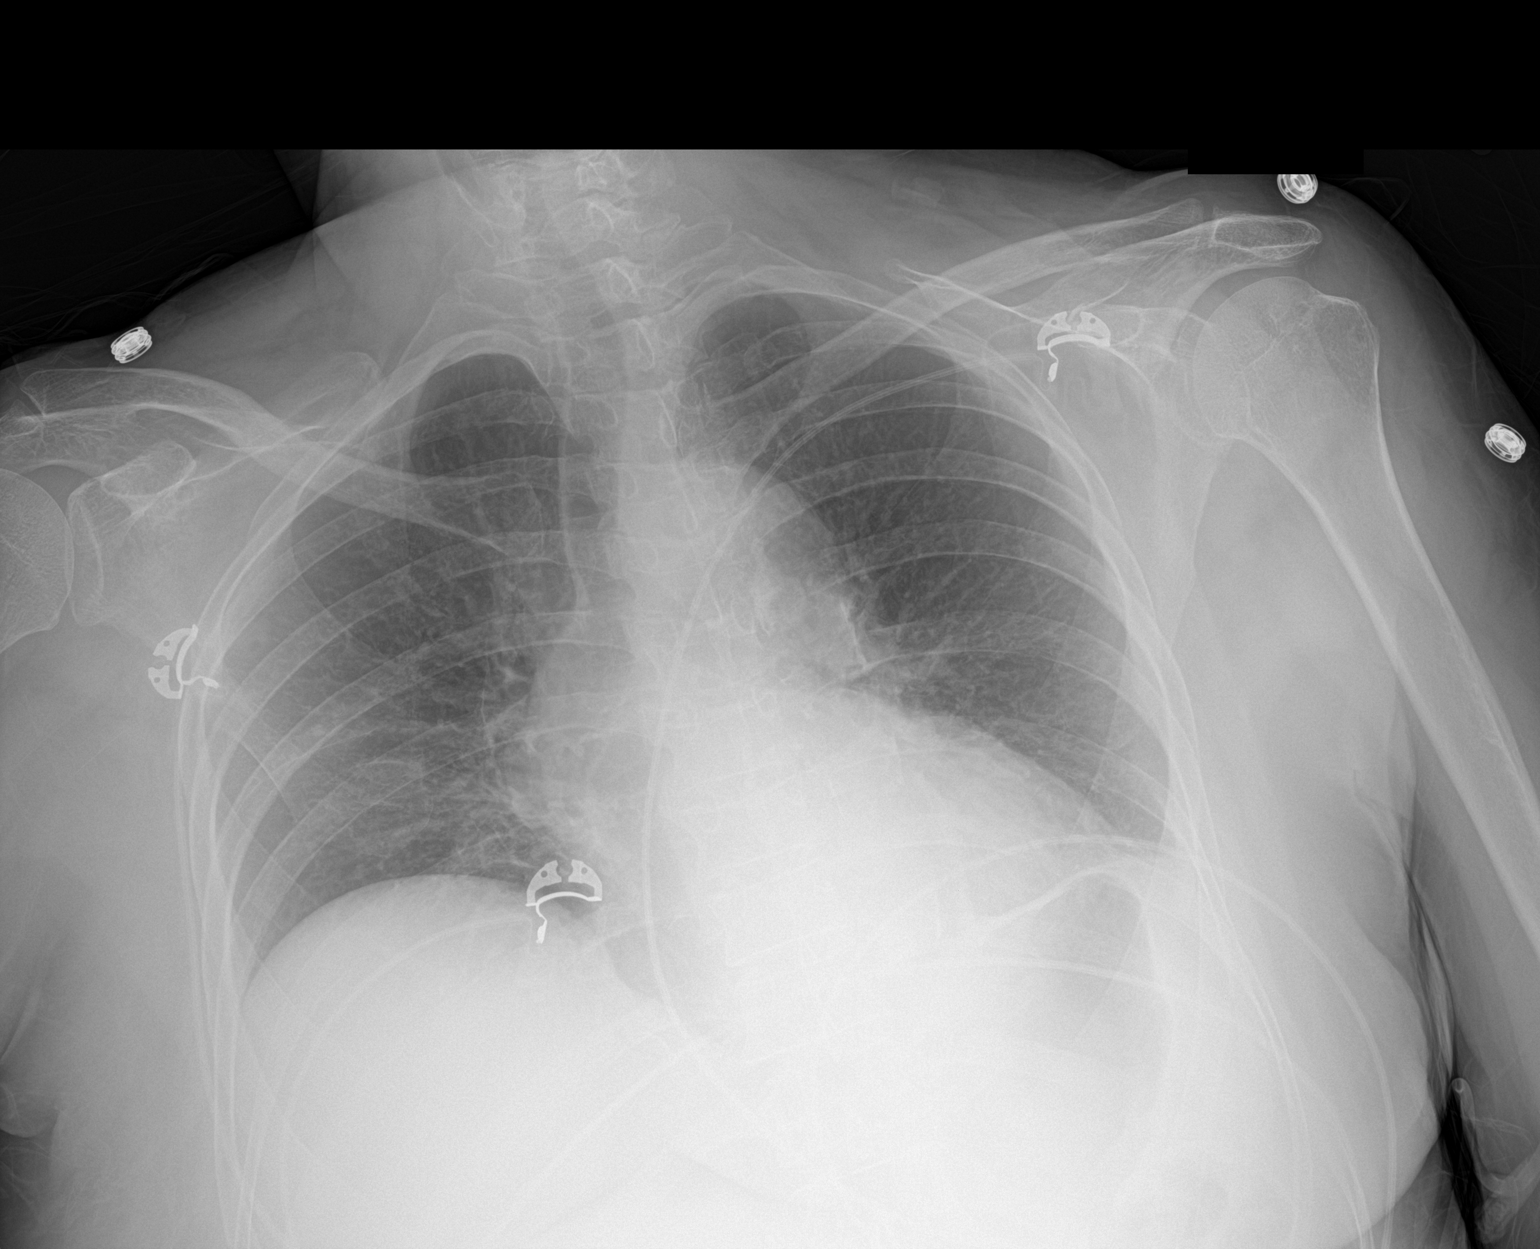

[1 of 1 positions shown; findings below may reference images not displayed]

FINDINGS: Cardiac shadow is stable. The overall inspiratory effort is poor
when compare with the previous exam. Elevation of the right
hemidiaphragm is again noted. No focal infiltrate or sizable
effusion is seen.
IMPRESSION: Poor inspiratory effort without focal acute abnormality.

## 2021-10-16 DEATH — deceased
